# Patient Record
Sex: Female | Born: 1980 | Hispanic: No | Marital: Married | Smoking: Never smoker
Health system: Southern US, Community
[De-identification: ages and names within clinical notes are randomized; demographics above are authoritative.]

## PROBLEM LIST (undated history)

## (undated) DIAGNOSIS — E039 Hypothyroidism, unspecified: Secondary | ICD-10-CM

## (undated) DIAGNOSIS — J45909 Unspecified asthma, uncomplicated: Secondary | ICD-10-CM

---

## 2019-03-07 ENCOUNTER — Other Ambulatory Visit: Payer: Self-pay | Admitting: Family Medicine

## 2019-03-07 DIAGNOSIS — O09521 Supervision of elderly multigravida, first trimester: Secondary | ICD-10-CM

## 2019-03-13 ENCOUNTER — Ambulatory Visit
Admission: RE | Admit: 2019-03-13 | Discharge: 2019-03-13 | Disposition: A | Discharge status: Home / Self Care | Payer: BC Managed Care – PPO | Source: Ambulatory Visit | Attending: Family Medicine | Admitting: Family Medicine

## 2019-03-13 ENCOUNTER — Other Ambulatory Visit: Payer: Self-pay

## 2019-03-13 DIAGNOSIS — O09521 Supervision of elderly multigravida, first trimester: Secondary | ICD-10-CM | POA: Diagnosis present

## 2019-05-09 NOTE — L&D Delivery Note (Signed)
Delivery Note 39yo G2P1001 at 18+0wks with previable PPROM and cord prolapse presented with 3 cm cervix and no fetal heart tones. She was given 2 doses of pv 487mcg misoprostol and spontaneously delivered a female infant without heartbeat.   The placenta followed a dose of Hemabate (with lomotil and acetaminophen) and 600 mcg misoprostol placed rectally. The placenta was gently extracted in pieces. Pt declined pain meds throughout, but tolerated manual extraction of fundal placenta. I will give her a dose of ancef following this procedure. A bedside ultrasound showed clots but no remaining membranes.   We have discussed going to the OR for a full exam under anesthesia and she declines at this time. We have discussed retained products and bleeding/infection to monitor for.  Anesthesia: None Episiotomy: None Lacerations: None Est. Blood Loss (mL):  923ml, including clots extracted 2 hrs after delivery with placenta.  Infant is female, with normal anatomy. Sealed eyes, no defects noted on close exam. 3 vessel cord.  Will discussed autopsy with patient prior to discharge.  Repeat CBC now and prior to discharge. Currently afebrile without fundal tenderness.  Benjaman Kindler 05/29/2019, 6:34 PM

## 2019-05-23 ENCOUNTER — Inpatient Hospital Stay
Admission: EM | Admit: 2019-05-23 | Discharge: 2019-05-25 | DRG: 833 | Disposition: A | Discharge status: Home / Self Care | Payer: BC Managed Care – PPO | Attending: Obstetrics and Gynecology | Admitting: Obstetrics and Gynecology

## 2019-05-23 ENCOUNTER — Encounter: Payer: Self-pay | Admitting: Emergency Medicine

## 2019-05-23 ENCOUNTER — Other Ambulatory Visit: Payer: Self-pay

## 2019-05-23 DIAGNOSIS — O99282 Endocrine, nutritional and metabolic diseases complicating pregnancy, second trimester: Secondary | ICD-10-CM | POA: Diagnosis present

## 2019-05-23 DIAGNOSIS — Z3A17 17 weeks gestation of pregnancy: Secondary | ICD-10-CM

## 2019-05-23 DIAGNOSIS — E039 Hypothyroidism, unspecified: Secondary | ICD-10-CM | POA: Diagnosis present

## 2019-05-23 DIAGNOSIS — O09522 Supervision of elderly multigravida, second trimester: Secondary | ICD-10-CM

## 2019-05-23 DIAGNOSIS — D259 Leiomyoma of uterus, unspecified: Secondary | ICD-10-CM | POA: Diagnosis present

## 2019-05-23 DIAGNOSIS — O368390 Maternal care for abnormalities of the fetal heart rate or rhythm, unspecified trimester, not applicable or unspecified: Secondary | ICD-10-CM

## 2019-05-23 DIAGNOSIS — O42912 Preterm premature rupture of membranes, unspecified as to length of time between rupture and onset of labor, second trimester: Secondary | ICD-10-CM

## 2019-05-23 DIAGNOSIS — O3412 Maternal care for benign tumor of corpus uteri, second trimester: Secondary | ICD-10-CM | POA: Diagnosis present

## 2019-05-23 DIAGNOSIS — O42919 Preterm premature rupture of membranes, unspecified as to length of time between rupture and onset of labor, unspecified trimester: Secondary | ICD-10-CM | POA: Diagnosis present

## 2019-05-23 DIAGNOSIS — O4290 Premature rupture of membranes, unspecified as to length of time between rupture and onset of labor, unspecified weeks of gestation: Secondary | ICD-10-CM | POA: Diagnosis present

## 2019-05-23 HISTORY — DX: Hypothyroidism, unspecified: E03.9

## 2019-05-23 HISTORY — DX: Unspecified asthma, uncomplicated: J45.909

## 2019-05-23 LAB — CBC
HCT: 33.2 % — ABNORMAL LOW (ref 36.0–46.0)
Hemoglobin: 11.1 g/dL — ABNORMAL LOW (ref 12.0–15.0)
MCH: 26.9 pg (ref 26.0–34.0)
MCHC: 33.4 g/dL (ref 30.0–36.0)
MCV: 80.6 fL (ref 80.0–100.0)
Platelets: 172 10*3/uL (ref 150–400)
RBC: 4.12 MIL/uL (ref 3.87–5.11)
RDW: 15.5 % (ref 11.5–15.5)
WBC: 12.2 10*3/uL — ABNORMAL HIGH (ref 4.0–10.5)
nRBC: 0 % (ref 0.0–0.2)

## 2019-05-23 LAB — URINALYSIS, COMPLETE (UACMP) WITH MICROSCOPIC
Bacteria, UA: NONE SEEN
Bilirubin Urine: NEGATIVE
Glucose, UA: NEGATIVE mg/dL
Hgb urine dipstick: NEGATIVE
Ketones, ur: 20 mg/dL — AB
Nitrite: NEGATIVE
Protein, ur: NEGATIVE mg/dL
Specific Gravity, Urine: 1.006 (ref 1.005–1.030)
pH: 6 (ref 5.0–8.0)

## 2019-05-23 LAB — WET PREP, GENITAL
Clue Cells Wet Prep HPF POC: NONE SEEN
Sperm: NONE SEEN
Trich, Wet Prep: NONE SEEN
Yeast Wet Prep HPF POC: NONE SEEN

## 2019-05-23 LAB — COMPREHENSIVE METABOLIC PANEL
ALT: 14 U/L (ref 0–44)
AST: 15 U/L (ref 15–41)
Albumin: 3.3 g/dL — ABNORMAL LOW (ref 3.5–5.0)
Alkaline Phosphatase: 53 U/L (ref 38–126)
Anion gap: 10 (ref 5–15)
BUN: 9 mg/dL (ref 6–20)
CO2: 20 mmol/L — ABNORMAL LOW (ref 22–32)
Calcium: 8.8 mg/dL — ABNORMAL LOW (ref 8.9–10.3)
Chloride: 107 mmol/L (ref 98–111)
Creatinine, Ser: 0.71 mg/dL (ref 0.44–1.00)
GFR calc Af Amer: >60 mL/min (ref >60)
GFR calc non Af Amer: >60 mL/min (ref >60)
Glucose, Bld: 85 mg/dL (ref 70–99)
Potassium: 3.7 mmol/L (ref 3.5–5.1)
Sodium: 137 mmol/L (ref 135–145)
Total Bilirubin: 0.5 mg/dL (ref 0.3–1.2)
Total Protein: 6.8 g/dL (ref 6.5–8.1)

## 2019-05-23 LAB — TYPE AND SCREEN
ABO/RH(D): A POS
Antibody Screen: NEGATIVE

## 2019-05-23 LAB — T4, FREE: Free T4: 0.75 ng/dL (ref 0.61–1.12)

## 2019-05-23 LAB — RUPTURE OF MEMBRANE (ROM)PLUS: Rom Plus: POSITIVE

## 2019-05-23 LAB — TSH: TSH: 1.108 u[IU]/mL (ref 0.350–4.500)

## 2019-05-23 MED ORDER — PRENATAL MULTIVITAMIN CH: 1.0000 | ORAL_TABLET | Freq: Every day | ORAL | Status: DC

## 2019-05-23 MED ORDER — ZOLPIDEM TARTRATE 5 MG PO TABS: 5.0000 mg | ORAL_TABLET | Freq: Every evening | ORAL | Status: DC | PRN

## 2019-05-23 MED ORDER — AZITHROMYCIN 500 MG PO TABS
1000.0000 mg | ORAL_TABLET | Freq: Once | ORAL | Status: AC
  Administered 2019-05-23: 1000 mg via ORAL
  Filled 2019-05-23: qty 2

## 2019-05-23 MED ORDER — SODIUM CHLORIDE 0.9 % IV SOLN
2.0000 g | Freq: Four times a day (QID) | INTRAVENOUS | Status: AC
  Administered 2019-05-23 – 2019-05-25 (×8): 2 g via INTRAVENOUS
  Filled 2019-05-23 (×8): qty 2000

## 2019-05-23 MED ORDER — LEVOTHYROXINE SODIUM 100 MCG PO TABS
100.0000 ug | ORAL_TABLET | Freq: Every day | ORAL | Status: DC
  Administered 2019-05-24 – 2019-05-25 (×2): 100 ug via ORAL
  Filled 2019-05-23 (×3): qty 1

## 2019-05-23 MED ORDER — ACETAMINOPHEN 325 MG PO TABS
650.0000 mg | ORAL_TABLET | ORAL | Status: DC | PRN
  Administered 2019-05-24: 650 mg via ORAL
  Filled 2019-05-23: qty 2

## 2019-05-23 MED ORDER — ONDANSETRON HCL 4 MG/2ML IJ SOLN: 4.0000 mg | Freq: Four times a day (QID) | INTRAMUSCULAR | Status: DC | PRN

## 2019-05-23 MED ORDER — LACTATED RINGERS IV SOLN: INTRAVENOUS | Status: DC

## 2019-05-23 MED ORDER — CALCIUM CARBONATE ANTACID 500 MG PO CHEW: 2.0000 | CHEWABLE_TABLET | ORAL | Status: DC | PRN

## 2019-05-23 NOTE — Progress Notes (Signed)
Patient ID: Jeanette Reyes, female   DOB: 12-01-1980, 39 y.o.   MRN: [id number] Interviewed pt tonight . PPROM today with + rom plus . Uncomplicated last pregnancy . Declined genetic studies at [facility] + covid infection Nov 2020.  Fever without respiratory compromise.  U/s today by me after Rn couldn't find FHT. + FHM 140's . 2 small pckets of amniotic 1.6+1.2 cm . VTX  Husband still in quarantine with + covid  O: Afebrile   Abd : soft Non tender  Pelvic deferred  A: PPROM midtrimester P: spoke to the pt of unlikely( very remote) chance of reseal and reaccummulation of fluid . Most likely scenerio of fetal demise and then starting induction for expulsion ie .. cytotec. Will treat conservatively for now with Ampicillin and azithromycin  Check FHM in am  Or if patient develops symptoms . Foley catheter placement  Ambien prn  Offered chaplain services

## 2019-05-23 NOTE — OB Triage Note (Signed)
Pt G2P1 [redacted]w[redacted]d complains of LOF since 0630 this morning. Patient states she noticed a gush that woke her up and states that it was clear and malodorous. She states that since then she has had fluid leak every time she gets up and it has remained clear and malodorours. Pt denies ctx and states she currently has no vaginal bleeding but has had bleeding throughout her entire pregnancy. She states the last time she noted bleeding was 05/03/19. Pt states she had sex 5 days ago and has had not trauma to the abdominal area. CNM at bedside to discuss plan of care with patient and answer any questions the patient had.

## 2019-05-23 NOTE — ED Triage Notes (Signed)
17 week preg, leaking clear fluid this morning. Only potential injury was she stumbled yesterday.  Denies cramping or pain

## 2019-05-23 NOTE — H&P (Signed)
OB History & Physical   History of Present Illness:  Chief Complaint: Leaking of fluid at 17 weeks  HPI:  Jeanette Reyes is a 39 y.o. G2P1001 female at [redacted]w[redacted]d dated by LMP of 01/18/2019.  She presents to L&D for leaking fluids.  She reports at 0630 she woke up and her clothes and the bed were wet. She got up to the use the restroom and the leaking continued.  She continues to feel gushing every time she is up walking around.    She reports:  -no vaginal bleeding -no contractions  Pregnancy Issues: 1. Advanced maternal age  32 ASA, 2. Prepregnancy BMI 32 ? A1c: 5.7, early 1h GTT 140  3h GTT [100,140,135,122] 3. Fibroid  ? 6x6x6cm on initial [place] at [facility] 4. First trimester bleeding ? Spotting has been an issue throughout first trimester 5. Hx infertility for yrs ? spontaneous conception. 6. Rubella equivocal ? offer PP Vaccination 7. COVID illness during pregnancy ? Pos 11/26 ? Family tested positive recently 43. Hx macrosomic infant ? 9#7 at 41.3wks  SVD, no shoulder dystocia, no GDM    9. Hypothyroidism  Synthroid 131mcg   Maternal Medical History:   Past Medical History:  Diagnosis Date  . Asthma    allergy induced  . Hypothyroidism     History reviewed. No pertinent surgical history.  No Known Allergies  Prior to Admission medications   Medication Sig Start Date End Date Taking? Authorizing Provider  aspirin 81 MG chewable tablet Chew by mouth daily.   Yes [provider]  levothyroxine (SYNTHROID) 100 MCG tablet Take 100 mcg by mouth daily before breakfast.   Yes [provider]  Prenatal Vit-Fe Fumarate-FA (MULTIVITAMIN-PRENATAL) 27-0.8 MG TABS tablet Take 1 tablet by mouth daily at 12 noon.   Yes [provider]     Prenatal care site: [place] History: She  reports that she has never smoked. She has never used smokeless tobacco. She reports that she does not drink alcohol or use drugs.  Family  History: family history is not on file.   Review of Systems: A full review of systems was performed and negative except as noted in the HPI.    Physical Exam:  Vital Signs: BP 130/64 (BP Location: Right Arm)   Pulse 91   Temp 97.9 F (36.6 C) (Oral)   Resp 16   Ht 6\' 5"  (1.956 m)   Wt 107.5 kg   LMP 01/18/2019   SpO2 100%   BMI 28.10 kg/m   General:   alert and cooperative  Skin:  normal  Neurologic:    Alert & oriented x 3  Lungs:   nl effort  Heart:   regular rate and rhythm  Abdomen:  soft non tender  Pelvis:  Exam deferred.  Extremities: : non-tender, symmetric     Results for orders placed or performed during the hospital encounter of 05/23/19 (from the past 24 hour(s))  Wet prep, genital     Status: Abnormal   Collection Time: 05/23/19 12:05 PM   Specimen: Cervix  Result Value Ref Range   Yeast Wet Prep HPF POC NONE SEEN NONE SEEN   Trich, Wet Prep NONE SEEN NONE SEEN   Clue Cells Wet Prep HPF POC NONE SEEN NONE SEEN   WBC, Wet Prep HPF POC MANY (A) NONE SEEN   Sperm NONE SEEN   CBC     Status: Abnormal   Collection Time: 05/23/19 12:40 PM  Result Value  Ref Range   WBC 12.2 (H) 4.0 - 10.5 K/uL   RBC 4.12 3.87 - 5.11 MIL/uL   Hemoglobin 11.1 (L) 12.0 - 15.0 g/dL   HCT 33.2 (L) 36.0 - 46.0 %   MCV 80.6 80.0 - 100.0 fL   MCH 26.9 26.0 - 34.0 pg   MCHC 33.4 30.0 - 36.0 g/dL   RDW 15.5 11.5 - 15.5 %   Platelets 172 150 - 400 K/uL   nRBC 0.0 0.0 - 0.2 %  CMP     Status: Abnormal   Collection Time: 05/23/19 12:40 PM  Result Value Ref Range   Sodium 137 135 - 145 mmol/L   Potassium 3.7 3.5 - 5.1 mmol/L   Chloride 107 98 - 111 mmol/L   CO2 20 (L) 22 - 32 mmol/L   Glucose, Bld 85 70 - 99 mg/dL   BUN 9 6 - 20 mg/dL   Creatinine, Ser 0.71 0.44 - 1.00 mg/dL   Calcium 8.8 (L) 8.9 - 10.3 mg/dL   Total Protein 6.8 6.5 - 8.1 g/dL   Albumin 3.3 (L) 3.5 - 5.0 g/dL   AST 15 15 - 41 U/L   ALT 14 0 - 44 U/L   Alkaline Phosphatase 53 38 - 126 U/L   Total Bilirubin 0.5  0.3 - 1.2 mg/dL   GFR calc non Af Amer >60 >60 mL/min   GFR calc Af Amer >60 >60 mL/min   Anion gap 10 5 - 15    Pertinent Results:  Prenatal Labs: Blood type/Rh A pos  Antibody screen neg  Rubella Equivical  Varicella Immune  RPR NR  HBsAg Neg  HIV NR  GC neg  Chlamydia neg  Genetic screening Declined  1 hour GTT   3 hour GTT   GBS    FHT: noted on U/S by Dr. Ouida Sills   Most recent U/s: 03/13/19 [facility]: Single IUP, yolk sac: visualized, cardiac activity: visualized, FHR=140bpm, [place]: none visualized, CRL = 9.9 mm, GA [redacted]w[redacted]d, left lower uterine fibroid 6.6x6.6x6.3  Assessment:  Jeanette Reyes is a 39 y.o. G2P1001 female at [redacted]w[redacted]d with PPROM at [redacted]w[redacted]d.  Plan:   Leaking Fluid ROM+ pos C/w Dr. Ouida Sills who attended at [facility] to do an u/s and discussed POC with pt Ampicillin 2g IV Q6hr x 48 hr Azithromycin 1000mg  PO once Complete Bedrest with Foley catheter Fetal doppler Q shift SCDs  Routine OB Prenatal Vitamin Regular Diet  Hypothyroidism Continue Synthroid 13mcg while admitted  Plan to admit to L&D for 48 hours in order to complete ABX therapy.  Will then re-evaluate fetal status and determine further action.     Linda Hedges, CNM 05/23/2019 1:31 PM

## 2019-05-23 NOTE — ED Notes (Signed)
Spoke with Dr .Kerman Passey in reference to pt and protocols.  At this time holding off on lab work

## 2019-05-23 NOTE — ED Provider Notes (Signed)
[facility] Emergency Department Provider Note  Time seen: 12:12 PM  I have reviewed the triage vital signs and the nursing notes.   HISTORY  Chief Complaint Vaginal Discharge   HPI Jianna Giudice is a 39 y.o. female G-tube P1 presents to the emergency department approximately [redacted] weeks pregnant with leakage of fluid.  According to the patient she awoke overnight and felt fluid going down her leg.  States she went back to bed and when she got up later this morning once again had a gush of fluid.  Patient states she follows up with [facility] clinic, call them this morning they asked her to come to the ER for further evaluation.  Patient denies any fever cough congestion or shortness of breath.  Denies any abdominal pain or cramping.  Patient states yesterday she did misstep and took a hard step that jarred her abdomen but denies any falls or direct trauma to the abdomen.  No history of fluid leakage or complications with her first pregnancy which was a NSVD.   History reviewed. No pertinent past medical history.  There are no problems to display for this patient.   History reviewed. No pertinent surgical history.  Prior to Admission medications   Not on File    No Known Allergies  No family history on file.  Social History Social History   Tobacco Use  . Smoking status: Never Smoker  . Smokeless tobacco: Never Used  Substance Use Topics  . Alcohol use: Not on file  . Drug use: Not on file    Review of Systems Constitutional: Negative for fever. Cardiovascular: Negative for chest pain. Respiratory: Negative for shortness of breath. Gastrointestinal: Negative for abdominal pain Genitourinary: No urinary symptoms.  Leakage of fluid vaginally this morning. Musculoskeletal: Negative for musculoskeletal complaints Neurological: Negative for headache All other ROS negative  ____________________________________________   PHYSICAL EXAM:  VITAL  SIGNS: ED Triage Vitals [05/23/19 0918]  Enc Vitals Group     BP 138/77     Pulse Rate 97     Resp 18     Temp 98.4 F (36.9 C)     Temp Source Oral     SpO2 100 %     Weight 236 lb (107 kg)     Height 6' (1.829 m)     Head Circumference      Peak Flow      Pain Score 0     Pain Loc      Pain Edu?      Excl. in [place]?    Constitutional: Alert and oriented. Well appearing and in no distress. Eyes: Normal exam ENT      Head: Normocephalic and atraumatic.      Mouth/Throat: Mucous membranes are moist. Cardiovascular: Normal rate, regular rhythm.  Respiratory: Normal respiratory effort without tachypnea nor retractions. Breath sounds are clear Gastrointestinal: Soft and nontender. No distention.   Musculoskeletal: Nontender with normal range of motion in all extremities Neurologic:  Normal speech and language. No gross focal neurologic deficits  Skin:  Skin is warm, dry and intact.  Psychiatric: Mood and affect are normal.   ____________________________________________   INITIAL IMPRESSION / ASSESSMENT AND PLAN / ED COURSE  Pertinent labs & imaging results that were available during my care of the patient were reviewed by me and considered in my medical decision making (see chart for details).   Patient presents to the emergency department [redacted] weeks pregnant with possible leakage of fluid.  Bedside ultrasound confirms  minimal amniotic fluid around intrauterine pregnancy with a heart rate of 162 on Doppler mode.  Pelvic exam confirms significant amount of clear fluid vaginally with a pH of 7.0.  Highly suspect premature rupture of membranes.  I discussed the patient with OB/GYN.  I have ordered basic labs and we will bring the patient upstairs to labor and delivery.  I discussed the patient with Dr. Ouida Sills as well as Linda Hedges of [place] clinic OB.  Basic labs have been sent.  They would like the patient brought upstairs to labor and delivery for further  evaluation.  Chondra Bloodsaw was evaluated in Emergency Department on 05/23/2019 for the symptoms described in the history of present illness. She was evaluated in the context of the global COVID-19 pandemic, which necessitated consideration that the patient might be at risk for infection with the SARS-CoV-2 virus that causes COVID-19. Institutional protocols and algorithms that pertain to the evaluation of patients at risk for COVID-19 are in a state of rapid change based on information released by regulatory bodies including the CDC and federal and state organizations. These policies and algorithms were followed during the patient's care in the ED.  ____________________________________________   FINAL CLINICAL IMPRESSION(S) / ED DIAGNOSES  Premature rupture of membranes   Harvest Dark, MD 05/23/19 1230

## 2019-05-23 NOTE — Discharge Instructions (Addendum)
Premature Rupture and Preterm Premature Rupture of Membranes  A sac made up of membranes surrounds your baby in the womb (uterus). Rupture of membranes is when this sac breaks open. This is also known as your "water breaking." When this sac breaks before labor starts, it is called premature rupture of membranes (PRO). If this happens before 37 weeks of being pregnant, it is called preterm premature rupture of membranes (PPROM). PPROM is serious. It needs medical care right away. What increases the risk of PPROM? PPROM is more likely to happen in women who:  Have an infection.  Have had PPROM before.  Have a cervix that is short.  Have bleeding during the second or third trimester.  Have a low BMI. This is a measure of body fat.  Smoke.  Use drugs.  Have a low socioeconomic status. What problems can be caused by PROM and PPROM? This condition creates health dangers for the mother and the baby. These include:  Giving birth to the baby too early (prematurely).  Getting a serious infection of the placenta (chorioamnionitis).  Having the placenta detach from the uterus early (placental abruption).  Squeezing of the umbilical cord.  Getting a serious infection after delivery. What happens if I am told that I have PROM or PPROM? You will have tests done at the hospital.  If you have PROM, you may be given medicine to start labor (be induced). This may be done if you are not having contractions during the 24 hours after your water broke.  If you have PPROM and are not having contractions, you may be given medicine to start labor. It will depend on how far along you are in your pregnancy. If you have PPROM:  You and your baby will be watched closely to see if you have infections or other problems.  You may be given: ? An antibiotic medicine. This can stop an infection from starting.  You may be told to stay in bed except to use the bathroom (bed rest).  You may be given  medicine to start labor. This may be done if there are problems with you or the baby. Your treatment will depend on many factors. Summary  When your water breaks before labor starts, it is called premature rupture of membranes (PROM).  When PROM happens before 37 weeks of pregnancy, it is called preterm premature rupture of membranes (PPROM).  If you are not having contractions, your labor may be started for you. This information is not intended to replace advice given to you by your health care provider. Make sure you discuss any questions you have with your health care provider. Document Revised: 08/16/2018 Document Reviewed: 01/13/2016 Elsevier Patient Education  [place].   Activity Restriction During Pregnancy Your health care provider may recommend specific activity restrictions during pregnancy for a variety of reasons. Activity restriction may require that you limit activities that require great effort, such as exercise, lifting, or sex. The type of activity restriction will vary for each person, depending on your risk or the problems you are having. Activity restriction may be recommended for a period of time until your baby is delivered. Why are activity restrictions recommended? Activity restriction may be recommended if:  Your placenta is partially or completely covering the opening of your cervix (placenta previa).  There is bleeding between the wall of the uterus and the amniotic sac in the first trimester of pregnancy (subchorionic hemorrhage).  You went into labor too early (preterm labor).  You  have a history of miscarriage.  You have a condition that causes high blood pressure during pregnancy (preeclampsia or eclampsia).  You are pregnant with more than one baby.  Your baby is not growing well. What are the risks? The risks depend on your specific restriction. Strict bed rest has the most physical and emotional risks and is no longer routinely  recommended. Risks of strict bed rest include:  Loss of muscle conditioning from not moving.  Blood clots.  Social isolation.  Depression.  Loss of income. Talk with your health care team about activity restriction to decide if it is best for you and your baby. Even if you are having problems during your pregnancy, you may be able to continue with normal levels of activity with careful monitoring by your health care team. Follow these instructions at home: If needed, based on your overall health and the health of your baby, your health care provider will decide which type of activity restriction is right for you. Activity restrictions may include:  Resting in a sitting position or lying down for periods of time during the day. Pelvic rest is recommended along with activity restrictions. If pelvic rest is recommended, then:  Do not have sex, an orgasm, or use sexual stimulation.  Do not use tampons. Do not douche. Do not put anything into your vagina.  Do not lift anything that is heavier than 10 lb (4.5 kg).  Avoid activities that require a lot of effort.  Avoid any activity in which your pelvic muscles could become strained, such as squatting. Questions to ask your health care provider  Why is my activity being limited?  How will activity restrictions affect my body?  Why is rest helpful for me and my baby?  What activities can I do?  When can I return to normal activities? When should I seek immediate medical care? Seek immediate medical care if you have:  Vaginal bleeding.  Vaginal discharge.  Cramping pain in your lower abdomen.  Regular contractions.  A low, dull backache. Summary  Your health care provider may recommend specific activity restrictions during pregnancy for a variety of reasons.  Activity restriction may require that you limit activities such as exercise, lifting, sex, or any other activity that requires great effort.  Discuss the risks and  benefits of activity restriction with your health care team to decide if it is best for you and your baby.  Contact your health care provider right away if you think you are having contractions, or if you notice vaginal bleeding, discharge, or cramping. This information is not intended to replace advice given to you by your health care provider. Make sure you discuss any questions you have with your health care provider. Document Revised: 01/15/2019 Document Reviewed: 08/14/2017 Elsevier Patient Education  [place].

## 2019-05-23 NOTE — ED Notes (Signed)
Pt transferred to L&D Obs 1. This RN called and spoke with RN staff in [place] who confirm patient is going to Obs 1 with Dr. Ouida Sills as the admitting MD. Pt transferred via wheelchair by this RN. Pt in stable condition at time of transfer to L&D Obs 1.

## 2019-05-23 NOTE — ED Notes (Addendum)
This Rn to bedside with the pelvic cart, pt given gown and instructed to change into gown for EDP to perform pelvic exam. Pt A&O x 4, NAD noted at this time, respirations even and unlabored, skin warm, dry, and intact. Pt states is a G2P1L1.

## 2019-05-24 ENCOUNTER — Inpatient Hospital Stay: Payer: BC Managed Care – PPO

## 2019-05-24 LAB — RPR: RPR Ser Ql: NONREACTIVE

## 2019-05-24 NOTE — Progress Notes (Signed)
ANTEPARTUM PROGRESS NOTE  Jeanette Reyes is a 39 y.o. G2P1001 at [redacted]w[redacted]d who is admitted for PPROM in second trimester.   LMP: 01/18/19 Estimated Date of Delivery: 10/30/19  Length of Stay:  1 Days. Admitted 05/23/2019  Subjective: Pt is emotional distressed and dispondent.  Her mother arrived this morning for support while husband and son are in quarantine.  Pt reports 2 episodes of diarrhea around midnight last night. This morning she reports a constant feeling of wetness but no gushes or significant leaking through the night, even with the BMs. Denies bleeding or UCs/cramping.  RN tried to find [place] this morning and was unable to, which was distressing to the pt so an u/s was ordered. FHT found on u/s at 178 bpm. Pt aware.   Vitals:  BP (!) 102/52 (BP Location: Right Arm)   Pulse 90   Temp (!) 97.5 F (36.4 C) (Oral)   Resp 16   Ht 6' (1.829 m)   Wt 107.5 kg   LMP 01/18/2019   SpO2 100%   BMI 32.14 kg/m    Physical Examination: General:   alert and moderate distress  Skin:  normal  Neurologic:    Alert & oriented x 3  Lungs:   nl effort  Heart:   regular rate and rhythm  Abdomen:  soft, non-tender  Pelvis:  Exam deferred.  Extremities: : non-tender, symmetric    Fetal monitoring: FHR: 178 bpm  Current scheduled medications . levothyroxine  100 mcg Oral Q0600  . prenatal multivitamin  1 tablet Oral Q1200    I have reviewed the patient's current medications.  ASSESSMENT: Patient Active Problem List   Diagnosis Date Noted  . Amniotic fluid leaking 05/23/2019  . Preterm premature rupture of membranes (PPROM) with unknown onset of labor 05/23/2019    PLAN:  Leaking Fluid Minimal leaking today Continue: Ampicillin 2g IV Q6hr x 48 hr D/c foley catheter and allow bedrest with bathroom priveledges Continue fetal doppler Q shift Continue SCDs  Routine OB Prenatal Vitamin Regular Diet  Hypothyroidism Continue Synthroid 127mcg while admitted  Pregnancy  Complicated by: 1. Advanced maternal age 45. Prepregnancy BMI 32 3. Fibroid  4. First trimester bleeding 5. Hx infertility for yrs - spontaneous conception 6. Rubella equivocal 7. COVID illness during pregnancy 8. Hx macrosomic infant 9. Hypothyroidism          Continue routine antenatal care.  Clydene Laming, CNM 05/24/2019 10:44 AM

## 2019-05-24 NOTE — Discharge Summary (Signed)
Patient ID: Jeanette Reyes MRN: [id number] DOB/AGE: 1981-01-13 39 y.o.  Admit date: 05/23/2019 Discharge date: 05/25/2019  Admission Diagnoses: Leaking of Fluid at [redacted]w[redacted]d  Discharge Diagnoses: PPROM at 17 weeks without labor.  Prenatal Procedures: none  Consults: none  Significant Diagnostic Studies:  Results for orders placed or performed during the hospital encounter of 05/23/19 (from the past 168 hour(s))  Wet prep, genital   Collection Time: 05/23/19 12:05 PM   Specimen: Cervix  Result Value Ref Range   Yeast Wet Prep HPF POC NONE SEEN NONE SEEN   Trich, Wet Prep NONE SEEN NONE SEEN   Clue Cells Wet Prep HPF POC NONE SEEN NONE SEEN   WBC, Wet Prep HPF POC MANY (A) NONE SEEN   Sperm NONE SEEN   CBC   Collection Time: 05/23/19 12:40 PM  Result Value Ref Range   WBC 12.2 (H) 4.0 - 10.5 K/uL   RBC 4.12 3.87 - 5.11 MIL/uL   Hemoglobin 11.1 (L) 12.0 - 15.0 g/dL   HCT 33.2 (L) 36.0 - 46.0 %   MCV 80.6 80.0 - 100.0 fL   MCH 26.9 26.0 - 34.0 pg   MCHC 33.4 30.0 - 36.0 g/dL   RDW 15.5 11.5 - 15.5 %   Platelets 172 150 - 400 K/uL   nRBC 0.0 0.0 - 0.2 %  CMP   Collection Time: 05/23/19 12:40 PM  Result Value Ref Range   Sodium 137 135 - 145 mmol/L   Potassium 3.7 3.5 - 5.1 mmol/L   Chloride 107 98 - 111 mmol/L   CO2 20 (L) 22 - 32 mmol/L   Glucose, Bld 85 70 - 99 mg/dL   BUN 9 6 - 20 mg/dL   Creatinine, Ser 0.71 0.44 - 1.00 mg/dL   Calcium 8.8 (L) 8.9 - 10.3 mg/dL   Total Protein 6.8 6.5 - 8.1 g/dL   Albumin 3.3 (L) 3.5 - 5.0 g/dL   AST 15 15 - 41 U/L   ALT 14 0 - 44 U/L   Alkaline Phosphatase 53 38 - 126 U/L   Total Bilirubin 0.5 0.3 - 1.2 mg/dL   GFR calc non Af Amer >60 >60 mL/min   GFR calc Af Amer >60 >60 mL/min   Anion gap 10 5 - 15  Urinalysis, Complete w Microscopic   Collection Time: 05/23/19  1:13 PM  Result Value Ref Range   Color, Urine STRAW (A) YELLOW   APPearance CLEAR (A) CLEAR   Specific Gravity, Urine 1.006 1.005 - 1.030   pH 6.0 5.0 - 8.0    Glucose, UA NEGATIVE NEGATIVE mg/dL   Hgb urine dipstick NEGATIVE NEGATIVE   Bilirubin Urine NEGATIVE NEGATIVE   Ketones, ur 20 (A) NEGATIVE mg/dL   Protein, ur NEGATIVE NEGATIVE mg/dL   Nitrite NEGATIVE NEGATIVE   Leukocytes,Ua TRACE (A) NEGATIVE   RBC / HPF 0-5 0 - 5 RBC/hpf   WBC, UA 0-5 0 - 5 WBC/hpf   Bacteria, UA NONE SEEN NONE SEEN   Squamous Epithelial / LPF 0-5 0 - 5  ROM Plus ([facility] only)   Collection Time: 05/23/19  2:15 PM  Result Value Ref Range   Rom Plus POSITIVE   RPR   Collection Time: 05/23/19  4:57 PM  Result Value Ref Range   RPR Ser Ql NON REACTIVE NON REACTIVE  TSH   Collection Time: 05/23/19  4:57 PM  Result Value Ref Range   TSH 1.108 0.350 - 4.500 uIU/mL  T4, free   Collection Time: 05/23/19  4:57 PM  Result Value Ref Range   Free T4 0.75 0.61 - 1.12 ng/dL  Type and screen [place]   Collection Time: 05/23/19  4:57 PM  Result Value Ref Range   ABO/RH(D) A POS    Antibody Screen NEG    Sample Expiration      05/26/2019,2359 Performed at [facility], [address] [place], [place] [address]     Ultrasounds: 05/24/19 LIMITED OBSTETRIC ULTRASOUND FINDINGS: Number of Fetuses: 1 Heart Rate: 178 bpm Movement: None Presentation: Transverse. Head to the left side of mother. Placental Location: Anterior Previa: None Amniotic Fluid (Subjective): Within normal limits. AFI: 1.3 cm BPD: 3.6 cm 17 w 1 d MATERNAL FINDINGS: Cervix: Appears closed. Uterus/Adnexae: Prominent uterine fibroid is again noted in lower uterine segment measuring 8.4 x 8.3 x 8.1 cm. IMPRESSION: 1. Single viable intrauterine pregnancy with estimated fetal heart rate of 178 beats per minute. Estimated 2. Estimated gestational age is 4 weeks and 2 days. 3. Oligohydramnios, consistent with premature rupture of membranes. Cervix does appear closed.  05/25/19 LIMITED OBSTETRIC ULTRASOUND Comparison: May 24, 2019. FINDINGS: Number  of Fetuses: 1 Heart Rate: 165 bpm Movement: Yes Presentation: Transverse Placental Location: Fundal and anterior. Previa: No Amniotic Fluid (Subjective): Decreased. AFI: 0.3 cm BPD: 3.8 cm 17 w 5 d MATERNAL FINDINGS: Cervix: Appears closed. Uterus/Adnexae: Large uterine fibroid is noted. IMPRESSION: Single live intrauterine gestation of 17 weeks 5 days with estimated heart rate of 165 beats per minute. However, oligohydramnios is again noted consistent with history of premature rupture of membranes.  CBC    Component Value Date/Time   WBC 10.9 (H) 05/25/2019 1300   RBC 4.15 05/25/2019 1300   HGB 11.3 (L) 05/25/2019 1300   HCT 33.7 (L) 05/25/2019 1300   PLT 161 05/25/2019 1300   MCV 81.2 05/25/2019 1300   MCH 27.2 05/25/2019 1300   MCHC 33.5 05/25/2019 1300   RDW 15.5 05/25/2019 1300   LYMPHSABS 2.3 05/25/2019 1300   MONOABS 0.6 05/25/2019 1300   EOSABS 0.1 05/25/2019 1300   BASOSABS 0.1 05/25/2019 1300   Treatments: Ampicillin 2g q6hr x 48hr Azithromycin 1g once [facility] Course:  This is a 39 y.o. G2P1001 with IUP at [redacted]w[redacted]d admitted for SROM.  She reported she woke up at 0630 and her clothes and the bed were wet. Once here she was started on Ampicillin x 48 hrs and Azithromycin once.  She was observed, fetal heart rate present everyday, and she had no signs/symptoms of progressing labor or infection.  She was deemed stable for discharge to home with outpatient follow up and PO ABX therapy.  Discharge Physical Exam:  BP 116/69   Pulse 83   Temp 98 F (36.7 C) (Oral)   Resp 16   Ht 6' (1.829 m)   Wt 107.5 kg   LMP 01/18/2019   SpO2 100%   BMI 32.14 kg/m   General: NAD CV: RRR Pulm: CTABL, nl effort ABD: s/nd/nt, gravid DVT Evaluation: LE non-ttp, no evidence of DVT on exam.   Discharge Condition: Stable  Disposition: Discharge disposition: 01-Home or Self Care     Home   Allergies as of 05/25/2019   No Known Allergies     Medication  List    TAKE these medications   ampicillin 500 MG capsule Commonly known as: PRINCIPEN Take 1 capsule (500 mg total) by mouth 3 (three) times daily for 5 days.   aspirin 81 MG chewable tablet Chew by mouth  daily.   levothyroxine 100 MCG tablet Commonly known as: SYNTHROID Take 100 mcg by mouth daily before breakfast.   multivitamin-prenatal 27-0.8 MG Tabs tablet Take 1 tablet by mouth daily at 12 noon.      Follow-up Information    Linda Hedges, CNM. Go on 05/27/2019.   Specialty: Certified Nurse Midwife Why: We will call you tomorrow if we need to adjust the time of your appointment Contact information: [place] [id number] [phone]           Signed:  Linda Hedges, CNM 05/25/2019 1:03 PM

## 2019-05-24 NOTE — Progress Notes (Signed)
Audible FHTs heard with doppler by patient and RN at bedside.  Rate unregistered on doppler.  CNM Oxley aware and OK with plan to reassess with next shift.

## 2019-05-25 ENCOUNTER — Inpatient Hospital Stay: Payer: BC Managed Care – PPO

## 2019-05-25 LAB — CBC WITH DIFFERENTIAL/PLATELET
Abs Immature Granulocytes: 0.04 10*3/uL (ref 0.00–0.07)
Basophils Absolute: 0.1 10*3/uL (ref 0.0–0.1)
Basophils Relative: 1 %
Eosinophils Absolute: 0.1 10*3/uL (ref 0.0–0.5)
Eosinophils Relative: 1 %
HCT: 33.7 % — ABNORMAL LOW (ref 36.0–46.0)
Hemoglobin: 11.3 g/dL — ABNORMAL LOW (ref 12.0–15.0)
Immature Granulocytes: 0 %
Lymphocytes Relative: 21 %
Lymphs Abs: 2.3 10*3/uL (ref 0.7–4.0)
MCH: 27.2 pg (ref 26.0–34.0)
MCHC: 33.5 g/dL (ref 30.0–36.0)
MCV: 81.2 fL (ref 80.0–100.0)
Monocytes Absolute: 0.6 10*3/uL (ref 0.1–1.0)
Monocytes Relative: 6 %
Neutro Abs: 7.8 10*3/uL — ABNORMAL HIGH (ref 1.7–7.7)
Neutrophils Relative %: 71 %
Platelets: 161 10*3/uL (ref 150–400)
RBC: 4.15 MIL/uL (ref 3.87–5.11)
RDW: 15.5 % (ref 11.5–15.5)
WBC: 10.9 10*3/uL — ABNORMAL HIGH (ref 4.0–10.5)
nRBC: 0 % (ref 0.0–0.2)

## 2019-05-25 MED ORDER — AMPICILLIN 500 MG PO CAPS: 500.0000 mg | ORAL_CAPSULE | Freq: Three times a day (TID) | ORAL | 0 refills | Status: DC

## 2019-05-25 MED ORDER — OXYCODONE HCL 5 MG PO TABS: 5.0000 mg | ORAL_TABLET | ORAL | Status: DC | PRN

## 2019-05-25 NOTE — Progress Notes (Signed)
Patient discharged to home, all instructions gone over, patient verbalized understanding.transported via wheelchair to car.

## 2019-05-25 NOTE — Progress Notes (Signed)
Patient ID: Jeanette Reyes, female   DOB: [birth year], 39 y.o.   MRN: [id number] eval of pt today with CNM  Oxley   + fhm on u/s , but no reaccumulation of AF  s/p 48 hrs of IV abx  Exam is benign  UTX non tender A: d/c home with strict precautions to RTC for fever , increasing abd pain or vaginal bleeding . She will follow up with CNM Oxley in office Tuesday with exam and u/s  All questions answered Cont Amoxicillin 500 mg tid for 5 days

## 2019-05-25 NOTE — Progress Notes (Signed)
ANTEPARTUM PROGRESS NOTE  Jeanette Reyes is a 39 y.o. G2P1001 at [redacted]w[redacted]d who is admitted for PPROM in second trimester.   LMP: 01/18/19 Estimated Date of Delivery: 10/30/19  Length of Stay:  2 Days. Admitted 05/23/2019  Subjective: Pt is laying in bed watching tv and more readily asking questions or and engaging in conversation. Her mother is with her and supportive while her husband and son are in quarantine.  This morning she reports not noting a constant feeling of wetness and no gushes or significant leaking through the night.  She does report mucousy discharge with strikes of blood.  Denies bleeding or UCs/cramping.  FHT found on u/s at 165 bpm. Pt aware.   Vitals:  BP 116/69   Pulse 83   Temp 97.8 F (36.6 C) (Oral)   Resp 16   Ht 6' (1.829 m)   Wt 107.5 kg   LMP 01/18/2019   SpO2 100%   BMI 32.14 kg/m    Physical Examination: General:   alert and moderate distress  Skin:  normal  Neurologic:    Alert & oriented x 3  Lungs:   nl effort  Heart:   regular rate and rhythm  Abdomen:  soft, non-tender  Pelvis:  Exam deferred.  Extremities: : non-tender, symmetric    Fetal monitoring: FHR: 165 bpm  Current scheduled medications . levothyroxine  100 mcg Oral Q0600  . prenatal multivitamin  1 tablet Oral Q1200    I have reviewed the patient's current medications.  ASSESSMENT: Patient Active Problem List   Diagnosis Date Noted  . Amniotic fluid leaking 05/23/2019  . Preterm premature rupture of membranes (PPROM) with unknown onset of labor 05/23/2019   Ultrasounds: 05/24/19 LIMITED OBSTETRIC ULTRASOUND FINDINGS: Number of Fetuses: 1 Heart Rate:  178 bpm Movement: None Presentation: Transverse.  Head to the left side of mother. Placental Location: Anterior Previa: None Amniotic Fluid (Subjective):  Within normal limits. AFI: 1.3 cm BPD: 3.6 cm 17 w  1 d MATERNAL FINDINGS: Cervix:  Appears closed. Uterus/Adnexae: Prominent uterine fibroid is again noted in lower  uterine segment measuring 8.4 x 8.3 x 8.1 cm. IMPRESSION: 1. Single viable intrauterine pregnancy with estimated fetal heart rate of 178 beats per minute. Estimated 2. Estimated gestational age is 62 weeks and 2 days. 3. Oligohydramnios, consistent with premature rupture of membranes. Cervix does appear closed.  05/25/19 LIMITED OBSTETRIC ULTRASOUND Comparison: May 24, 2019. FINDINGS: Number of Fetuses: 1 Heart Rate:  165 bpm Movement: Yes Presentation: Transverse Placental Location: Fundal and anterior. Previa: No Amniotic Fluid (Subjective):  Decreased. AFI: 0.3 cm BPD: 3.8 cm 17 w  5 d MATERNAL FINDINGS: Cervix:  Appears closed. Uterus/Adnexae: Large uterine fibroid is noted. IMPRESSION: Single live intrauterine gestation of 17 weeks 5 days with estimated heart rate of 165 beats per minute. However, oligohydramnios is again noted consistent with history of premature rupture of membranes.  PLAN:  Leaking Fluid Minimal leaking today Continue: Ampicillin 2g IV Q6hr x 48 hr Continue fetal doppler Q shift Continue SCDs Will discuss discharge for later today  Routine OB Prenatal Vitamin Regular Diet  Hypothyroidism Continue Synthroid 174mcg while admitted  Pregnancy Complicated by: 1. Advanced maternal age 79. Prepregnancy BMI 32 3. Fibroid  4. First trimester bleeding 5. Hx infertility for yrs - spontaneous conception 6. Rubella equivocal 7. COVID illness during pregnancy 8. Hx macrosomic infant 9. Hypothyroidism          Continue routine antenatal care.  Clydene Laming, CNM 05/25/2019 11:15 AM

## 2019-05-29 ENCOUNTER — Other Ambulatory Visit: Payer: Self-pay | Admitting: Certified Nurse Midwife

## 2019-05-29 ENCOUNTER — Other Ambulatory Visit: Payer: Self-pay

## 2019-05-29 ENCOUNTER — Inpatient Hospital Stay
Admission: EM | Admit: 2019-05-29 | Discharge: 2019-05-30 | DRG: 806 | Disposition: A | Discharge status: Home / Self Care | Payer: BC Managed Care – PPO | Attending: Obstetrics and Gynecology | Admitting: Obstetrics and Gynecology

## 2019-05-29 ENCOUNTER — Encounter: Payer: Self-pay | Admitting: Certified Nurse Midwife

## 2019-05-29 DIAGNOSIS — O09522 Supervision of elderly multigravida, second trimester: Secondary | ICD-10-CM | POA: Diagnosis not present

## 2019-05-29 DIAGNOSIS — O9952 Diseases of the respiratory system complicating childbirth: Secondary | ICD-10-CM | POA: Diagnosis present

## 2019-05-29 DIAGNOSIS — O98511 Other viral diseases complicating pregnancy, first trimester: Secondary | ICD-10-CM | POA: Diagnosis present

## 2019-05-29 DIAGNOSIS — J45909 Unspecified asthma, uncomplicated: Secondary | ICD-10-CM | POA: Diagnosis present

## 2019-05-29 DIAGNOSIS — Z79890 Hormone replacement therapy (postmenopausal): Secondary | ICD-10-CM | POA: Diagnosis not present

## 2019-05-29 DIAGNOSIS — Z3A18 18 weeks gestation of pregnancy: Secondary | ICD-10-CM | POA: Diagnosis not present

## 2019-05-29 DIAGNOSIS — Z23 Encounter for immunization: Secondary | ICD-10-CM

## 2019-05-29 DIAGNOSIS — O3412 Maternal care for benign tumor of corpus uteri, second trimester: Secondary | ICD-10-CM | POA: Diagnosis present

## 2019-05-29 DIAGNOSIS — Z7982 Long term (current) use of aspirin: Secondary | ICD-10-CM

## 2019-05-29 DIAGNOSIS — Z8616 Personal history of COVID-19: Secondary | ICD-10-CM | POA: Diagnosis not present

## 2019-05-29 DIAGNOSIS — O42919 Preterm premature rupture of membranes, unspecified as to length of time between rupture and onset of labor, unspecified trimester: Secondary | ICD-10-CM | POA: Diagnosis present

## 2019-05-29 DIAGNOSIS — O4290 Premature rupture of membranes, unspecified as to length of time between rupture and onset of labor, unspecified weeks of gestation: Secondary | ICD-10-CM

## 2019-05-29 DIAGNOSIS — O99284 Endocrine, nutritional and metabolic diseases complicating childbirth: Secondary | ICD-10-CM | POA: Diagnosis present

## 2019-05-29 DIAGNOSIS — O42912 Preterm premature rupture of membranes, unspecified as to length of time between rupture and onset of labor, second trimester: Secondary | ICD-10-CM | POA: Diagnosis present

## 2019-05-29 DIAGNOSIS — E039 Hypothyroidism, unspecified: Secondary | ICD-10-CM | POA: Diagnosis present

## 2019-05-29 LAB — CBC
HCT: 35 % — ABNORMAL LOW (ref 36.0–46.0)
HCT: 32.4 % — ABNORMAL LOW (ref 36.0–46.0)
Hemoglobin: 11.6 g/dL — ABNORMAL LOW (ref 12.0–15.0)
Hemoglobin: 10.8 g/dL — ABNORMAL LOW (ref 12.0–15.0)
MCH: 26.8 pg (ref 26.0–34.0)
MCH: 26.8 pg (ref 26.0–34.0)
MCHC: 33.1 g/dL (ref 30.0–36.0)
MCHC: 33.3 g/dL (ref 30.0–36.0)
MCV: 80.8 fL (ref 80.0–100.0)
MCV: 80.4 fL (ref 80.0–100.0)
Platelets: 183 10*3/uL (ref 150–400)
Platelets: 187 10*3/uL (ref 150–400)
RBC: 4.33 MIL/uL (ref 3.87–5.11)
RBC: 4.03 MIL/uL (ref 3.87–5.11)
RDW: 15.3 % (ref 11.5–15.5)
RDW: 14.8 % (ref 11.5–15.5)
WBC: 12.8 10*3/uL — ABNORMAL HIGH (ref 4.0–10.5)
WBC: 17.5 10*3/uL — ABNORMAL HIGH (ref 4.0–10.5)
nRBC: 0 % (ref 0.0–0.2)
nRBC: 0 % (ref 0.0–0.2)

## 2019-05-29 LAB — PROTIME-INR
INR: 1.1 (ref 0.8–1.2)
Prothrombin Time: 14.2 seconds (ref 11.4–15.2)

## 2019-05-29 LAB — TYPE AND SCREEN
ABO/RH(D): A POS
Antibody Screen: NEGATIVE

## 2019-05-29 MED ORDER — OXYCODONE-ACETAMINOPHEN 5-325 MG PO TABS: 2.0000 | ORAL_TABLET | ORAL | Status: DC | PRN

## 2019-05-29 MED ORDER — SENNOSIDES-DOCUSATE SODIUM 8.6-50 MG PO TABS: 2.0000 | ORAL_TABLET | ORAL | Status: DC

## 2019-05-29 MED ORDER — BENZOCAINE-MENTHOL 20-0.5 % EX AERO: 1.0000 "application " | INHALATION_SPRAY | CUTANEOUS | Status: DC | PRN

## 2019-05-29 MED ORDER — FERROUS SULFATE 325 (65 FE) MG PO TABS: 325.0000 mg | ORAL_TABLET | Freq: Every day | ORAL | 1 refills | Status: AC

## 2019-05-29 MED ORDER — MISOPROSTOL 200 MCG PO TABS: 600.0000 ug | ORAL_TABLET | Freq: Once | ORAL | Status: DC

## 2019-05-29 MED ORDER — OXYCODONE-ACETAMINOPHEN 5-325 MG PO TABS: 1.0000 | ORAL_TABLET | ORAL | Status: DC | PRN

## 2019-05-29 MED ORDER — SIMETHICONE 80 MG PO CHEW: 80.0000 mg | CHEWABLE_TABLET | ORAL | Status: DC | PRN

## 2019-05-29 MED ORDER — CARBOPROST TROMETHAMINE 250 MCG/ML IM SOLN
INTRAMUSCULAR | Status: AC
  Filled 2019-05-29: qty 1

## 2019-05-29 MED ORDER — BISACODYL 10 MG RE SUPP
10.0000 mg | Freq: Every day | RECTAL | Status: DC | PRN
  Filled 2019-05-29: qty 1

## 2019-05-29 MED ORDER — TETANUS-DIPHTH-ACELL PERTUSSIS 5-2.5-18.5 LF-MCG/0.5 IM SUSP
0.5000 mL | Freq: Once | INTRAMUSCULAR | Status: AC
  Administered 2019-05-29: 0.5 mL via INTRAMUSCULAR
  Filled 2019-05-29 (×2): qty 0.5

## 2019-05-29 MED ORDER — IBUPROFEN 800 MG PO TABS: 800.0000 mg | ORAL_TABLET | Freq: Three times a day (TID) | ORAL | 1 refills | Status: AC | PRN

## 2019-05-29 MED ORDER — DIPHENOXYLATE-ATROPINE 2.5-0.025 MG PO TABS
ORAL_TABLET | ORAL | Status: AC
  Filled 2019-05-29: qty 1

## 2019-05-29 MED ORDER — DIPHENHYDRAMINE HCL 25 MG PO CAPS: 25.0000 mg | ORAL_CAPSULE | Freq: Four times a day (QID) | ORAL | Status: DC | PRN

## 2019-05-29 MED ORDER — IBUPROFEN 600 MG PO TABS: 600.0000 mg | ORAL_TABLET | Freq: Four times a day (QID) | ORAL | Status: DC

## 2019-05-29 MED ORDER — DOCUSATE SODIUM 100 MG PO CAPS: 100.0000 mg | ORAL_CAPSULE | Freq: Two times a day (BID) | ORAL | 0 refills | Status: AC

## 2019-05-29 MED ORDER — ACETAMINOPHEN 500 MG PO TABS
ORAL_TABLET | ORAL | Status: AC
  Filled 2019-05-29: qty 2

## 2019-05-29 MED ORDER — ZOLPIDEM TARTRATE 5 MG PO TABS: 5.0000 mg | ORAL_TABLET | Freq: Every evening | ORAL | Status: DC | PRN

## 2019-05-29 MED ORDER — DIPHENOXYLATE-ATROPINE 2.5-0.025 MG PO TABS
2.0000 | ORAL_TABLET | Freq: Once | ORAL | Status: AC
  Administered 2019-05-29: 2 via ORAL

## 2019-05-29 MED ORDER — ACETAMINOPHEN 500 MG PO TABS
1000.0000 mg | ORAL_TABLET | Freq: Once | ORAL | Status: AC
  Administered 2019-05-29: 1000 mg via ORAL

## 2019-05-29 MED ORDER — ACETAMINOPHEN 325 MG PO TABS: 650.0000 mg | ORAL_TABLET | ORAL | Status: DC | PRN

## 2019-05-29 MED ORDER — ONDANSETRON HCL 4 MG/2ML IJ SOLN: 4.0000 mg | Freq: Four times a day (QID) | INTRAMUSCULAR | Status: DC | PRN

## 2019-05-29 MED ORDER — CARBOPROST TROMETHAMINE 250 MCG/ML IM SOLN
250.0000 ug | Freq: Once | INTRAMUSCULAR | Status: AC
  Administered 2019-05-29: 250 ug via INTRAMUSCULAR

## 2019-05-29 MED ORDER — SODIUM CHLORIDE 0.9% FLUSH: 3.0000 mL | INTRAVENOUS | Status: DC | PRN

## 2019-05-29 MED ORDER — LACTATED RINGERS IV SOLN: INTRAVENOUS | Status: DC

## 2019-05-29 MED ORDER — LACTATED RINGERS IV SOLN: 500.0000 mL | INTRAVENOUS | Status: DC | PRN

## 2019-05-29 MED ORDER — OXYCODONE HCL 5 MG PO TABS: 5.0000 mg | ORAL_TABLET | ORAL | Status: DC | PRN

## 2019-05-29 MED ORDER — MISOPROSTOL 200 MCG PO TABS
400.0000 ug | ORAL_TABLET | ORAL | Status: AC
  Administered 2019-05-29 (×2): 400 ug via VAGINAL
  Filled 2019-05-29: qty 2

## 2019-05-29 MED ORDER — SOD CITRATE-CITRIC ACID 500-334 MG/5ML PO SOLN: 30.0000 mL | ORAL | Status: DC | PRN

## 2019-05-29 MED ORDER — OXYTOCIN BOLUS FROM INFUSION: 500.0000 mL | Freq: Once | INTRAVENOUS | Status: DC

## 2019-05-29 MED ORDER — SODIUM CHLORIDE 0.9 % IV SOLN: 250.0000 mL | INTRAVENOUS | Status: DC | PRN

## 2019-05-29 MED ORDER — SODIUM CHLORIDE 0.9% FLUSH: 3.0000 mL | Freq: Two times a day (BID) | INTRAVENOUS | Status: DC

## 2019-05-29 MED ORDER — MEASLES, MUMPS & RUBELLA VAC IJ SOLR
0.5000 mL | Freq: Once | INTRAMUSCULAR | Status: AC
  Administered 2019-05-29: 0.5 mL via SUBCUTANEOUS
  Filled 2019-05-29 (×2): qty 0.5

## 2019-05-29 MED ORDER — DIBUCAINE (PERIANAL) 1 % EX OINT: 1.0000 "application " | TOPICAL_OINTMENT | CUTANEOUS | Status: DC | PRN

## 2019-05-29 MED ORDER — OXYTOCIN 40 UNITS IN NORMAL SALINE INFUSION - SIMPLE MED: 2.5000 [IU]/h | INTRAVENOUS | Status: DC

## 2019-05-29 MED ORDER — FENTANYL CITRATE (PF) 100 MCG/2ML IJ SOLN: 50.0000 ug | INTRAMUSCULAR | Status: DC | PRN

## 2019-05-29 MED ORDER — ONDANSETRON HCL 4 MG/2ML IJ SOLN: 4.0000 mg | INTRAMUSCULAR | Status: DC | PRN

## 2019-05-29 MED ORDER — COCONUT OIL OIL: 1.0000 "application " | TOPICAL_OIL | Status: DC | PRN

## 2019-05-29 MED ORDER — LIDOCAINE HCL (PF) 1 % IJ SOLN: 30.0000 mL | INTRAMUSCULAR | Status: DC | PRN

## 2019-05-29 MED ORDER — CEFAZOLIN SODIUM-DEXTROSE 2-4 GM/100ML-% IV SOLN
2.0000 g | Freq: Once | INTRAVENOUS | Status: AC
  Administered 2019-05-29: 2 g via INTRAVENOUS
  Filled 2019-05-29: qty 100

## 2019-05-29 MED ORDER — MISOPROSTOL 200 MCG PO TABS
ORAL_TABLET | ORAL | Status: AC
  Filled 2019-05-29: qty 2

## 2019-05-29 MED ORDER — WITCH HAZEL-GLYCERIN EX PADS: 1.0000 "application " | MEDICATED_PAD | CUTANEOUS | Status: DC | PRN

## 2019-05-29 MED ORDER — ONDANSETRON HCL 4 MG PO TABS: 4.0000 mg | ORAL_TABLET | ORAL | Status: DC | PRN

## 2019-05-29 NOTE — Progress Notes (Signed)
Opened in error

## 2019-05-29 NOTE — H&P (Signed)
OB History & Physical   History of Present Illness:  Chief Complaint:   HPI:  Jeanette Reyes is a 39 y.o. G2P1001 female at [redacted]w[redacted]d dated by LMP of 01/18/2019.  She presents to L&D suspecting the umbilical cord is in the vagina. On 05/23/19 at [redacted]w[redacted]d she was admitted for SROM.  She reported she woke up at 0630 and her clothes and the bed were wet. PV PROM without labor was confirmed.  Once in L&D she was started on Ampicillin x 48 hrs and Azithromycin once.  She was observed, fetal heart rate present everyday of admission, and she had no signs/symptoms of progressing labor or infection.  She was deemed stable for discharge to home with outpatient follow up and PO ABX therapy.  She was then seen in the office on 05/27/19 for visit.  U/s showed FHT and small pocket of fluid.  She reports:  - LOF that has not changed over the last 7 days / SROM at 17 weeks -no vaginal bleeding -no contractions or cramping  Pregnancy Issues: 1. Advanced maternal age  5 ASA, 2. Prepregnancy BMI 32 ? A1c: 5.7, early 1h GTT 140  3h GTT [100,140,135,122] 3. Fibroid  ? 6x6x6cm on initial [place] at [facility] 4. First trimester bleeding ? Spotting has been an issue throughout first trimester 5. Hx infertility for yrs ? spontaneous conception. 6. Rubella equivocal ? offer PP Vaccination 7. COVID illness during pregnancy ? Pos 11/26 ? Family tested positive recently 23. Hx macrosomic infant ? 9#7 at 41.3wks  SVD, no shoulder dystocia, no GDM    9. Hypothyroidism            Synthroid 114mcg   Maternal Medical History:   Past Medical History:  Diagnosis Date  . Asthma    allergy induced  . Hypothyroidism     History reviewed. No pertinent surgical history.  No Known Allergies  Prior to Admission medications   Medication Sig Start Date End Date Taking? Authorizing Provider  ampicillin (PRINCIPEN) 500 MG capsule Take 1 capsule (500 mg total) by mouth 3 (three) times daily for 5 days. 05/25/19 05/30/19 Yes  Linda Hedges, CNM  levothyroxine (SYNTHROID) 100 MCG tablet Take 100 mcg by mouth daily before breakfast.   Yes [provider]  Prenatal Vit-Fe Fumarate-FA (MULTIVITAMIN-PRENATAL) 27-0.8 MG TABS tablet Take 1 tablet by mouth daily at 12 noon.   Yes [provider]  aspirin 81 MG chewable tablet Chew by mouth daily.    [provider]     Prenatal care site: [place]  Social History: She  reports that she has never smoked. She has never used smokeless tobacco. She reports that she does not drink alcohol or use drugs.  Family History: family history is not on file.   Review of Systems: A full review of systems was performed and negative except as noted in the HPI.    Physical Exam:  Vital Signs: BP 128/61 (BP Location: Left Arm)   Pulse 92   Temp 98 F (36.7 C) (Oral)   Resp 18   Ht 6' (1.829 m)   Wt 107.5 kg   LMP 01/18/2019   BMI 32.14 kg/m   General:   alert, cooperative and moderate distress  Skin:  normal  Neurologic:    Alert & oriented x 3  Lungs:   nl effort  Heart:   regular rate and rhythm  Abdomen:  soft, non-tender  Pelvis:  Visual inspection showed umbilical cord loop hanging out of  the vagina.  SVE: umbilical cord only felt in the vagina.  Cervix:    Dilation: 3cm   Effacement: 50%   Station:  Floating   Consistency: soft   Position: n/a  Extremities: : non-tender, symmetric      No results found for this or any previous visit (from the past 24 hour(s)).  Pertinent Results:  Prenatal Labs: Blood type/Rh A pos  Antibody screen neg  Rubella Equivical  Varicella Immune  RPR NR  HBsAg Neg  HIV NR  GC neg  Chlamydia neg  Genetic screening Declined  1 hour GTT   3 hour GTT   GBS    Ultrasounds:   05/29/19 Bedside U/s by Dr. Leafy Ro confirmed absence of FHTs  US OB Limited  Date: 05/27/2019 Presentation: breech, EFW: CRL 11.83 cm c/w [redacted]w[redacted]d, FHR: 168, Placenta: Ant fundal, AFI: MVP 1.67 cm, Lt lower  fibroid: 80.0 mm  US OB Limited  Result Date: 05/25/2019 CLINICAL DATA:  Inability to hear fetal heart tones. Premature rupture of membranes. EXAM: LIMITED OBSTETRIC ULTRASOUND Comparison: May 24, 2019. FINDINGS: Number of Fetuses: 1 Heart Rate:  165 bpm Movement: Yes Presentation: Transverse Placental Location: Fundal and anterior. Previa: No Amniotic Fluid (Subjective):  Decreased. AFI: 0.3 cm BPD: 3.8 cm 17 w  5 d MATERNAL FINDINGS: Cervix:  Appears closed. Uterus/Adnexae: Large uterine fibroid is noted. IMPRESSION: Single live intrauterine gestation of 17 weeks 5 days with estimated heart rate of 165 beats per minute. However, oligohydramnios is again noted consistent with history of premature rupture of membranes. This exam is performed on an emergent basis and does not comprehensively evaluate fetal size, dating, or anatomy; follow-up complete OB US should be considered if further fetal assessment is warranted. Electronically Signed   By: Marijo Conception M.D.   On: 05/25/2019 10:57   US OB Limited  Result Date: 05/24/2019 CLINICAL DATA:  Unable to obtain fetal heart rate. Premature rupture of membranes. EXAM: LIMITED OBSTETRIC ULTRASOUND FINDINGS: Number of Fetuses: 1 Heart Rate:  178 bpm Movement: None Presentation: Transverse.  Head to the left side of mother. Placental Location: Anterior Previa: None Amniotic Fluid (Subjective):  Within normal limits. AFI: 1.3 cm BPD: 3.6 cm 17 w  1 d MATERNAL FINDINGS: Cervix:  Appears closed. Uterus/Adnexae: Prominent uterine fibroid is again noted in lower uterine segment measuring 8.4 x 8.3 x 8.1 cm. IMPRESSION: 1. Single viable intrauterine pregnancy with estimated fetal heart rate of 178 beats per minute. Estimated 2. Estimated gestational age is 39 weeks and 2 days. 3. Oligohydramnios, consistent with premature rupture of membranes. Cervix does appear closed. This exam is performed on an emergent basis and does not comprehensively evaluate fetal size,  dating, or anatomy; follow-up complete OB US should be considered if further fetal assessment is warranted. Electronically Signed   By: San Morelle M.D.   On: 05/24/2019 11:53     Assessment:  Jeanette Reyes is a 39 y.o. G2P1001 female at [redacted]w[redacted]d with suspecting the umbilical cord is in the vagina after a diagnosis of PV PPROM on 05/23/19.   Plan:  1. Admit to Labor & Delivery; consents reviewed and obtained, care co-managed with Dr. Leafy Ro  2. Fetal Viability and delivery preparation - FHT absent - INR lab added to orders   3. Admit care orders: - Prenatal labs reviewed, as above - Rh pos - CBC & T&S on admit - Clear fluids, IVF  4. Induction of Labor - No contractions or cramping on arrival  - Plan  for induction with Misoprostol 469mcg q4hrs until delivery -  Maternal pain control as desired - Anticipate vaginal delivery  5. Post Delivery Planning: - Offered Chaplain Services    [place], [place] 05/29/2019 10:05 AM

## 2019-05-29 NOTE — Progress Notes (Signed)
Labor Progress Note  Jeanette Reyes is a 39 y.o. G2P1001 at [redacted]w[redacted]d by LMP of 01/18/2019.  She presents to L&D with cord prolapse. PV PPROM since 05/23/19.  Subjective: Pt reports uterine pressure.  Denies bleeding, or cramping.    Objective: BP 112/64   Pulse 97   Temp 98.3 F (36.8 C) (Oral)   Resp 18   Ht 6' (1.829 m)   Wt 107.5 kg   LMP 01/18/2019   SpO2 97%   BMI 32.14 kg/m    SVE:    Dilation: 7 cm  Effacement: 90%  Station:  -1  Consistency: soft  Position: n/a   Labs: Lab Results  Component Value Date   WBC 12.8 (H) 05/29/2019   HGB 11.6 (L) 05/29/2019   HCT 35.0 (L) 05/29/2019   MCV 80.8 05/29/2019   PLT 183 05/29/2019    Assessment / Plan: G2P1 at [redacted]w[redacted]d induction for fetal demise after PPROM at 79 weeks.   Placed Misoprotol 400 mcg PV  Pain Control:  Reviewed the availibity of epidural if she would like I/D:  Last temp 98.3 Anticipated MOD:  Vaginal delivery  Clydene Laming, CNM 05/29/2019, 2:44 PM

## 2019-05-29 NOTE — Discharge Instructions (Signed)
Please call with fevers or body aches, or with increasing pain or bleeding. I'd like to see you in 1 week if you otherwise feel well.   Managing Pregnancy Loss Pregnancy loss can happen any time during a pregnancy. Often the cause is not known. It is rarely because of anything you did. Pregnancy loss in early pregnancy (during the first trimester) is called a miscarriage. This type of pregnancy loss is the most common. Pregnancy loss that happens after 20 weeks of pregnancy is called fetal demise if the baby's heart stops beating before birth. Fetal demise is much less common. Some women experience spontaneous labor shortly after fetal demise resulting in a stillborn birth (stillbirth). Any pregnancy loss can be devastating. You will need to recover both physically and emotionally. Most women are able to get pregnant again after a pregnancy loss and deliver a healthy baby.  Two resources are the organization "Now I lay me down to sleep" and the [employer] group at [facility].   How to manage emotional recovery  Pregnancy loss is very hard emotionally. You may feel many different emotions while you grieve. You may feel sad and angry. You may also feel guilty. It is normal to have periods of crying. Emotional recovery can take longer than physical recovery. It is different for everyone. Taking these steps can help you in managing this loss:  Remember that it is unlikely you did anything to cause the pregnancy loss.  Share your thoughts and feelings with friends, family, and your partner. Remember that your partner is also recovering emotionally.  Make sure you have a good support system. Do not spend too much time alone.  Meet with a pregnancy loss counselor or join a pregnancy loss support group.  Get enough sleep and eat a healthy diet. Return to regular exercise when you have recovered physically.  Do not use drugs or alcohol to manage your emotions.  Consider seeing a mental health  professional to help you recover emotionally.  Ask a friend or loved one to help you decide what to do with any clothing and nursery items you received for your baby. In the case of a stillbirth, many women benefit from taking additional steps in the grieving process. You may want to:  Hold your baby after the birth.  Name your baby.  Request a birth certificate.  Create a keepsake such as handprints or footprints.  Dress your baby and have a picture taken.  Make funeral arrangements.  Ask for a baptism or blessing. Hospitals have staff members who can help you with all these arrangements. How to recognize emotional stress It is normal to have emotional stress after a pregnancy loss. But emotional stress that lasts a long time or becomes severe requires treatment. Watch out for these signs of severe emotional stress:  Sadness, anger, or guilt that is not going away and is interfering with your normal activities.  Relationship problems that have occurred or gotten worse since the pregnancy loss.  Signs of depression that last longer than 2 weeks. These may include: ? Sadness. ? Anxiety. ? Hopelessness. ? Loss of interest in activities you enjoy. ? Inability to concentrate. ? Trouble sleeping or sleeping too much. ? Loss of appetite or overeating. ? Thoughts of death or of hurting yourself. Follow these instructions at home:  Take over-the-counter and prescription medicines only as told by your health care provider.  Rest at home until your energy level returns. Return to your normal activities as told by  your health care provider. Ask your health care provider what activities are safe for you.  When you are ready, meet with your health care provider to discuss steps to take for a future pregnancy.  Keep all follow-up visits as told by your health care provider. This is important. Where to find support  To help you and your partner with the process of grieving, talk with  your health care provider or seek counseling.  Consider meeting with others who have experienced pregnancy loss. Ask your health care provider about support groups and resources. Where to find more information  [place] Department of Health and Programmer, systems on Women's Health: [place]  American Pregnancy Association: www.americanpregnancy.org Contact a health care provider if:  You continue to experience grief, sadness, or lack of motivation for everyday activities, and those feelings do not improve over time.  You are struggling to recover emotionally, especially if you are using alcohol or substances to help. Get help right away if:  You have thoughts of hurting yourself or others. If you ever feel like you may hurt yourself or others, or have thoughts about taking your own life, get help right away. You can go to your nearest emergency department or call:  Your local emergency services (911 in the [place]  A suicide crisis helpline, such as the [place] at (510) 316-0837. This is open 24 hours a day. Summary  Any pregnancy loss can be difficult physically and emotionally.  You may experience many different emotions while you grieve. Emotional recovery can last longer than physical recovery.  It is normal to have emotional stress after a pregnancy loss. But emotional stress that lasts a long time or becomes severe requires treatment.  See your health care provider if you are struggling emotionally after a pregnancy loss. This information is not intended to replace advice given to you by your health care provider. Make sure you discuss any questions you have with your health care provider. Document Revised: 08/14/2018 Document Reviewed: 07/05/2017 Elsevier Patient Education  2020 [employer].

## 2019-05-29 NOTE — Progress Notes (Signed)
Labor Progress Note  Brendalee Binion is a 39 y.o. G2P1001 at [redacted]w[redacted]d by LMP of 01/18/2019.  She presents to L&D with cord prolapse. PV PPROM since 05/23/19.  Subjective: Called to bedside for delivery.  At arrival baby born and in mother's arms.  Placenta not delivered.  Objective: BP 112/64   Pulse 97   Temp 98.1 F (36.7 C) (Oral)   Resp 18   Ht 6' (1.829 m)   Wt 107.5 kg   LMP 01/18/2019   SpO2 97%   BMI 32.14 kg/m   Labs: Lab Results  Component Value Date   WBC 12.8 (H) 05/29/2019   HGB 11.6 (L) 05/29/2019   HCT 35.0 (L) 05/29/2019   MCV 80.8 05/29/2019   PLT 183 05/29/2019    Assessment / Plan: G2P1 at [redacted]w[redacted]d induction for fetal demise after PPROM at 53 weeks.   Baby born at 54, female C/w Dr. Leafy Ro  - Hemabate 224mcg IM, Lomotil 2 tabs PO and Tylenol 1g PO    - May need to give another dose of Cytotec after 1839 if placenta is not delivered  Pain Control:  Pt not requesting medication at this time I/D:  Last temp 98.1   Clydene Laming, CNM 05/29/2019, 4:21 PM

## 2019-05-29 NOTE — Progress Notes (Signed)
Admitting  for PV PPROM in possible labor

## 2019-05-29 NOTE — Discharge Summary (Signed)
Obstetrical Discharge Summary  Patient Name: Jeanette Reyes DOB: [birth year] MRN: [id number]  Date of Admission: 05/29/2019 Date of Discharge: 05/30/2019  Primary OB: [facility]  Gestational Age at Delivery: [redacted]w[redacted]d  Antepartum complications:   1. Advanced maternal age  B36ASA, 2. Prepregnancy BMI 32 ? A1c: 5.7, early 1h GTT 140  3h GTT [100,140,135,122] 3. Fibroid  ? 6x6x6cm on initial [facility]4. First trimester bleeding ? Spotting has been an issue throughout first trimester 5. Hx infertility for yrs ? spontaneous conception. 6. Rubella equivocal ? offer PP Vaccination 7. COVID illness during pregnancy ? Pos 11/26 ? Family tested positive recently 864 Hx macrosomic infant ? 9#7 at 41.3wks  SVD, no shoulder dystocia, no GDM 9. Hypothyroidism Synthroid 1026m  Admitting Diagnosis: PPROM, cord prolapse Secondary Diagnosis: Patient Active Problem List   Diagnosis Date Noted  . Amniotic fluid leaking 05/23/2019  . Preterm premature rupture of membranes (PPROM) with unknown onset of labor 05/23/2019   Augmentation: Cytotec Complications: Stillborn Intrapartum complications/course:  PPROM at 17+1wks, cord prolapse with fetal demise at home at 18wks  Date of Delivery: 05/29/2019 Delivered By: BeBenjaman Kindlerelivery Type: spontaneous vaginal delivery Anesthesia: none Placenta: extracted Laceration: none Episiotomy: none Newborn Data: Live born female "Micah"  Birth Weight:  pending APGAR: 0  Newborn Delivery   Birth date/time: 05/29/2019 16:02:00 Delivery type: Vaginal, Spontaneous       Brief Hospital Course  Jeanette Reyes a G2P1001 who had a SVD on 05/29/2019;  for further details of this delivery, please refer to the delivery note.  Patient had an uncomplicated postpartum course.  By time of discharge on PPD#1, her pain was controlled on oral pain medications; she had appropriate lochia and was ambulating, voiding without  difficulty and tolerating regular diet.  She was deemed stable for discharge to home.    Her repeat WBC after delivery was 17. She received a single dose of 2g Ancef after delivery of the placenta and was afebrile with vital signs stable. Strict infection precautions given.  Discharge Physical Exam:  BP (!) 114/58   Pulse 83   Temp 98.6 F (37 C) (Oral)   Resp 18   Ht 6' (1.829 m)   Wt 107.5 kg   LMP 01/18/2019   SpO2 97%   BMI 32.14 kg/m   General: NAD CV: RRR Pulm: CTABL, nl effort ABD: s/nd/nt, fundus firm and below the umbilicus Lochia: moderate DVT Evaluation: LE non-ttp, no evidence of DVT on exam.  Hemoglobin  Date Value Ref Range Status  05/29/2019 10.8 (L) 12.0 - 15.0 g/dL Final   HCT  Date Value Ref Range Status  05/29/2019 32.4 (L) 36.0 - 46.0 % Final    Post partum course: uncomplicated Postpartum Procedures: MMR due Disposition: stable, discharge to home. Baby Disposition: morgue  Rh Immune globulin given: Rubella vaccine given:  Tdap vaccine given in AP or PP setting:  Flu vaccine given in AP or PP setting:   Contraception: none  Prenatal Labs: Blood type/Rh --/--/A POS (01/21 094045 Antibody screen neg  Rubella Equivocal  Varicella Immune  RPR NR  HBsAg Neg  HIV NR  GC neg  Chlamydia neg  Genetic screening declined  1 hour GTT   3 hour GTT   GBS      Plan:  Jeanette Reyes to home in good condition. [facility]   Discharge Medications: Allergies as of 05/30/2019  No Known Allergies     Medication List    STOP taking these medications   ampicillin 500 MG capsule Commonly known as: PRINCIPEN   aspirin 81 MG chewable tablet     TAKE these medications   docusate sodium 100 MG capsule Commonly known as: Colace Take 1 capsule (100 mg total) by mouth 2 (two) times daily for 14 days. To keep stools soft, as needed   ferrous sulfate 325 (65  FE) MG tablet Take 1 tablet (325 mg total) by mouth daily with breakfast. Take with Vitamin C   ibuprofen 800 MG tablet Commonly known as: ADVIL Take 1 tablet (800 mg total) by mouth every 8 (eight) hours as needed for moderate pain or cramping.   levothyroxine 100 MCG tablet Commonly known as: SYNTHROID Take 100 mcg by mouth daily before breakfast.   multivitamin-prenatal 27-0.8 MG Tabs tablet Take 1 tablet by mouth daily at 12 noon.       Follow-up Information    Benjaman Kindler, MD. Schedule an appointment as soon as possible for a visit in 1 day(s).   Specialty: Obstetrics and Gynecology Contact information: [address] [place] [address] [phone]           Signed: Benjaman Kindler 06/25/19

## 2019-05-29 NOTE — Progress Notes (Signed)
39yo G2P1001 at 18+0wks who is on day 6 of Previable PPROM, presented today with cord prolapse from the vagina. No fetal heartbeat on evaluation. No contractions.  She received iv abx for PPROM over the weekend and is currently on oral abx.  Vitals:   05/29/19 1015 05/29/19 1020  BP:    Pulse:    Resp:    Temp:    SpO2: 98% 97%    WBC 12, up from 10 2 days ago Blood type A  Pos  Cervix: 3cm/thick/high, with multiple loops of cord present in the vagina Fundus: No fundal tenderness No odor or evidence on exam of IUI  A: Previable PPROM with cord prolapse, intrauterine fetal demise  P:  479mcg cytotec per vagina q4 hrs. Likelihood of retained placenta at 2 hrs 15% Serial temps: No abx at this time. Abx if IUI suspected Pastoral care consult if needed [place] if desired  Pt and husband at bedside; all questions answered

## 2019-06-02 ENCOUNTER — Encounter: Payer: Self-pay | Admitting: Obstetrics and Gynecology

## 2019-06-02 LAB — SURGICAL PATHOLOGY

## 2019-06-03 ENCOUNTER — Encounter: Payer: Self-pay | Admitting: Obstetrics and Gynecology

## 2019-12-07 IMAGING — US US OB < 14 WEEKS - US OB TV
1 series · 1 of 1 positions shown · non-contrast
Comparison: None.

CLINICAL DATA: Multi gravid first trimester pregnancy

EXAM:
OBSTETRIC <14 WK US AND TRANSVAGINAL OB US
TECHNIQUE: Transvaginal ultrasound was performed for complete evaluation of the
gestation as well as the maternal uterus, adnexal regions, and
pelvic cul-de-sac.

[Series 1: us ob < 14 weeks - us ob tv · 0.09mm/px · 1 of 1 slices shown]
[im 1/1]
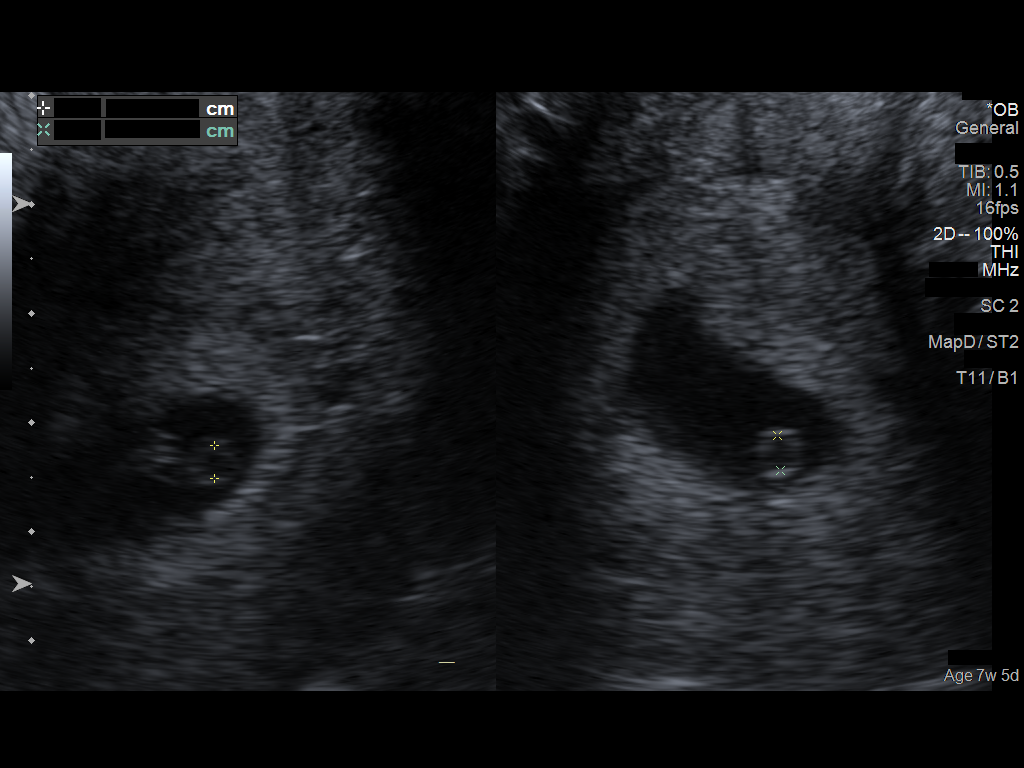

[1 of 1 positions shown; findings below may reference images not displayed]

FINDINGS: Intrauterine gestational sac: Single

Yolk sac:  Visualized

Embryo:  Visualized

Cardiac Activity: Visualized

Heart Rate: One hundred forty bpm

CRL: 9.9  mm   7 w 0 d                  US EDC: 10/30/2019

Subchorionic hemorrhage:  None visualized.

Maternal uterus/adnexae: 2.4 cm anechoic right ovarian mass likely
reflecting a cyst. 6.6 x 6.6 x 6.3 cm left lower uterine hypoechoic
mass most consistent with a fibroid. No pelvic free fluid.
IMPRESSION: 1. Single live intrauterine pregnancy as detailed above.
2. 6.6 x 6.6 x 6.3 cm left lower uterine fibroid.

## 2020-02-17 IMAGING — US US OB LIMITED
1 series · 14 of 25 positions shown · non-contrast
Comparison: none

CLINICAL DATA: Unable to obtain fetal heart rate. Premature rupture
of membranes.

EXAM:
LIMITED OBSTETRIC ULTRASOUND

[Series 1: us ob limited · 25 acquisitions, 14 frames shown]
[im 1/25]
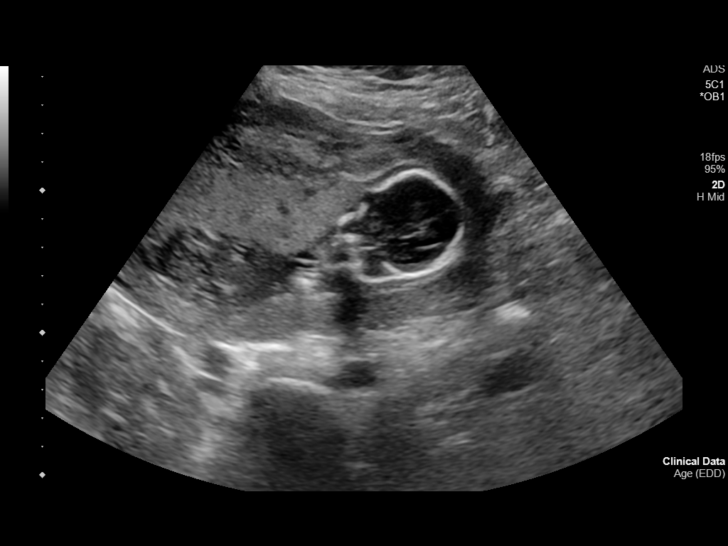
[im 3/25]
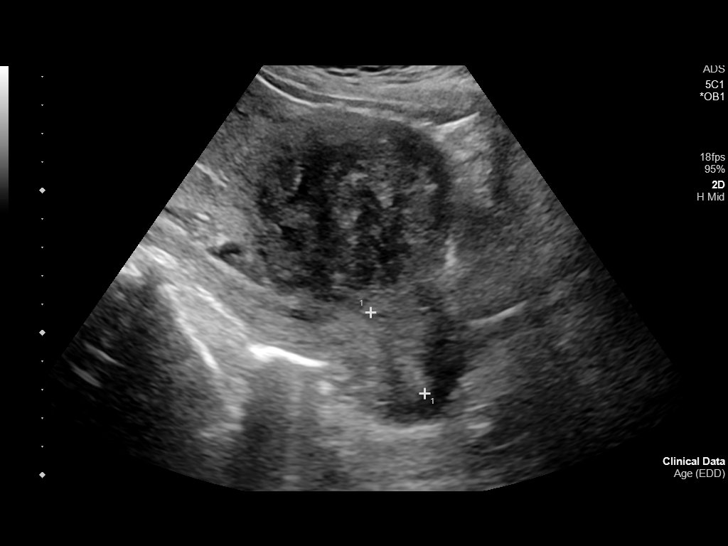
[im 5/25]
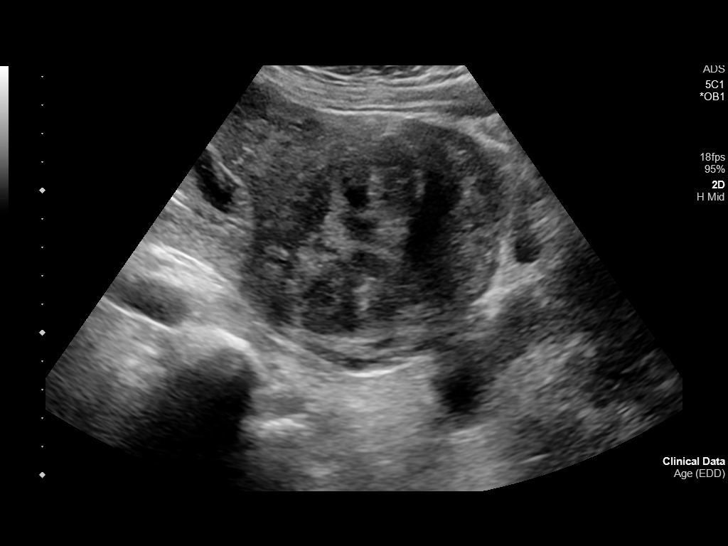
[im 7/25]
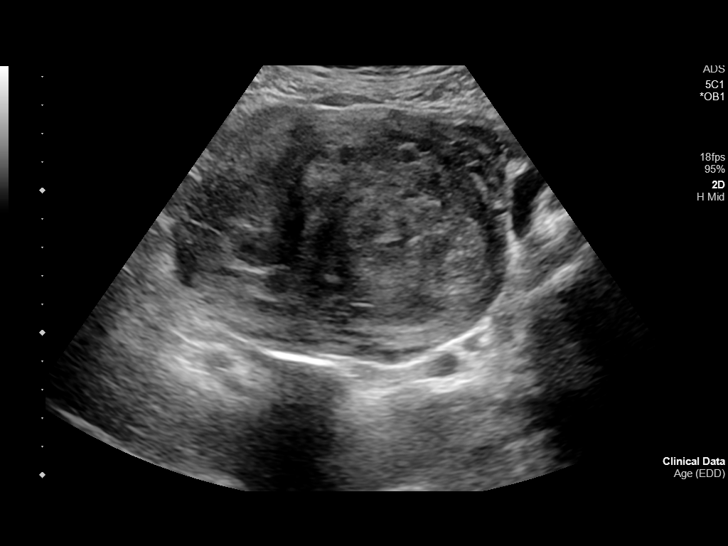
[im 9/25]
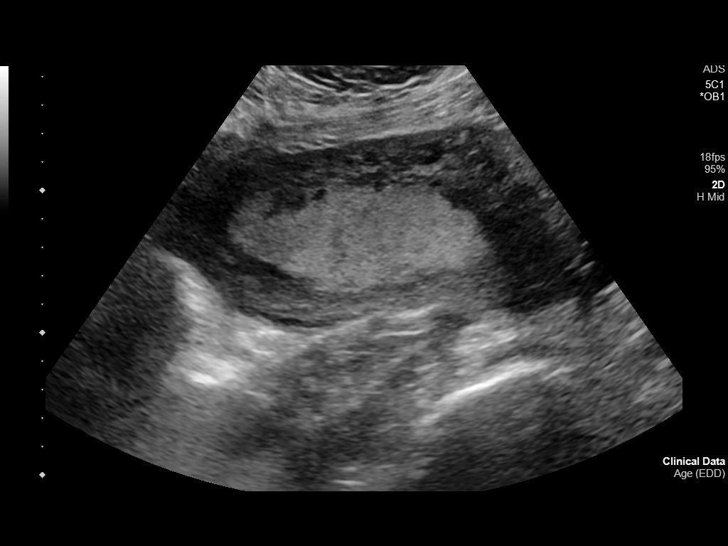
[im 10/25]
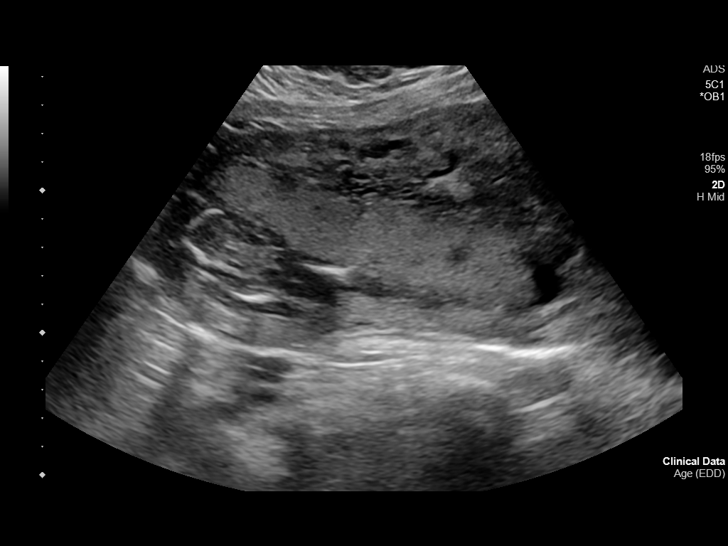
[im 12/25]
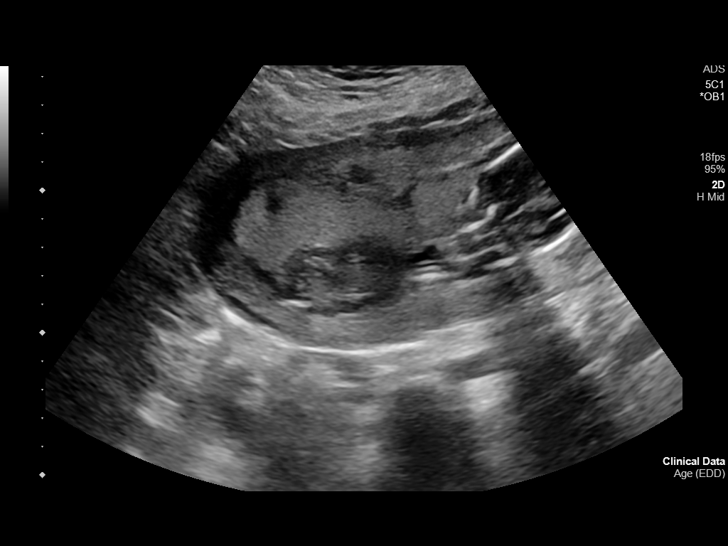
[im 14/25]
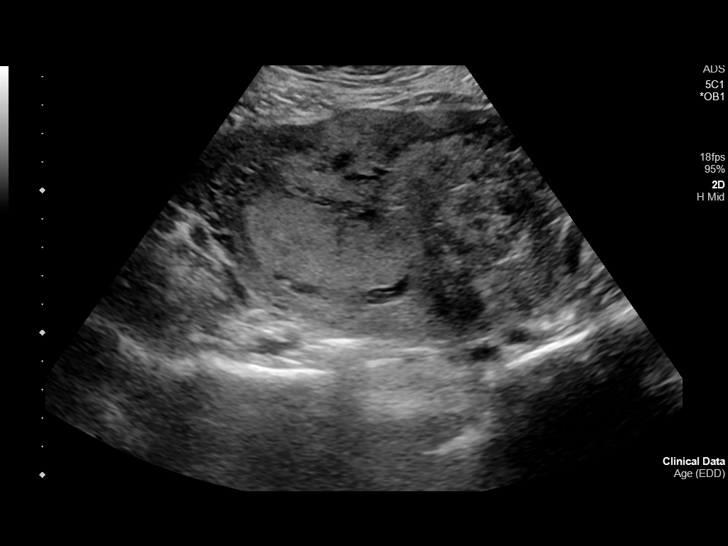
[im 16/25]
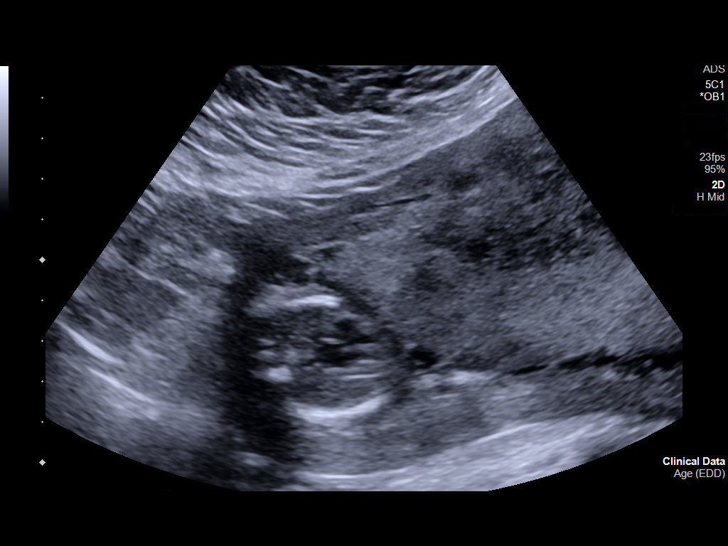
[im 17/25]
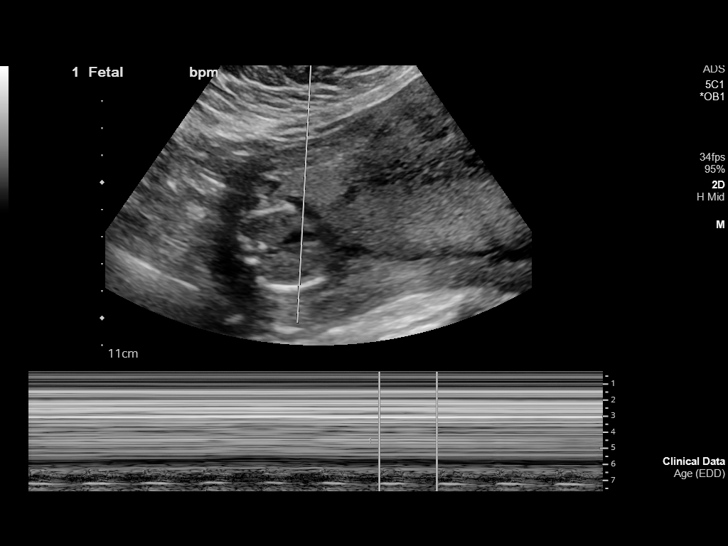
[im 19/25]
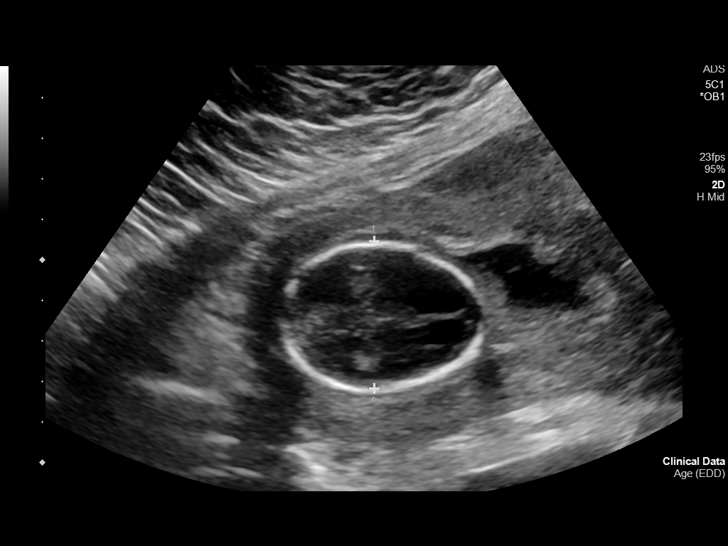
[im 21/25]
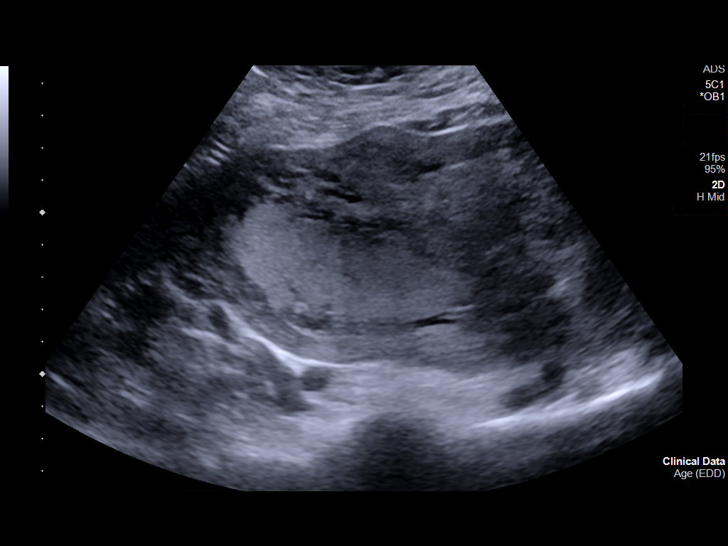
[im 23/25]
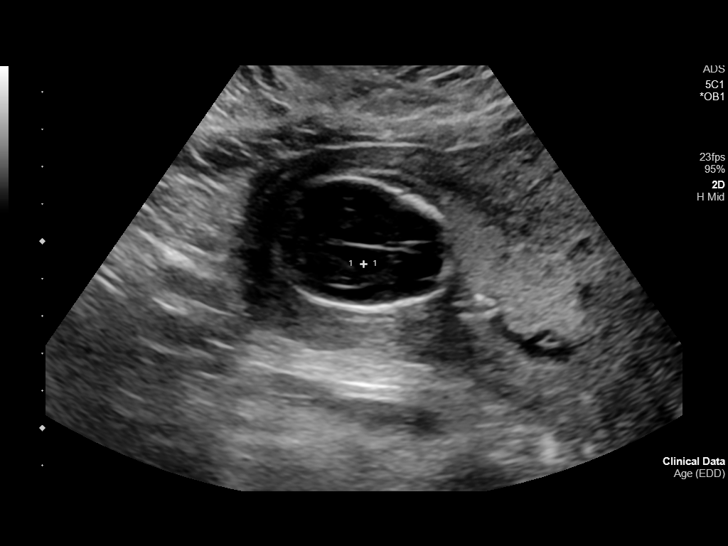
[im 25/25]
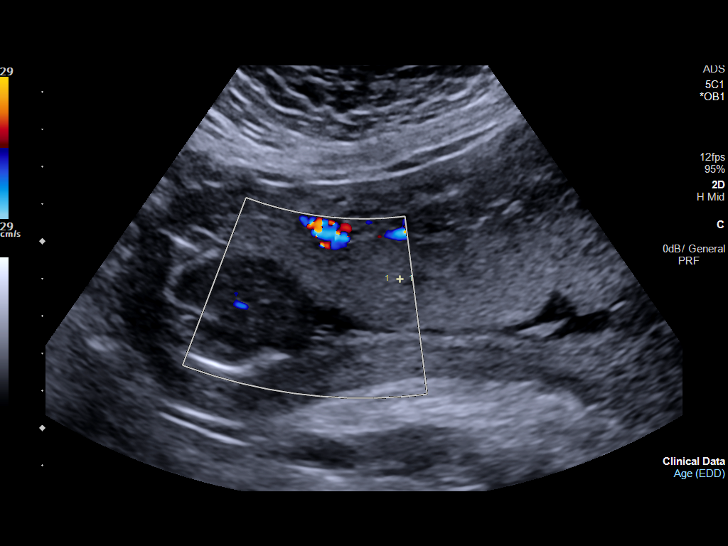

[14 of 25 positions shown; findings below may reference images not displayed]

FINDINGS: Number of Fetuses: 1

Heart Rate:  178 bpm

Movement: None

Presentation: Transverse.  Head to the left side of mother.

Placental Location: Anterior

Previa: None

Amniotic Fluid (Subjective):  Within normal limits.

AFI: 1.3 cm

BPD: 3.6 cm 17 w  1 d

MATERNAL FINDINGS:

Cervix:  Appears closed.

Uterus/Adnexae: Prominent uterine fibroid is again noted in lower
uterine segment measuring 8.4 x 8.3 x 8.1 cm.
IMPRESSION: 1. Single viable intrauterine pregnancy with estimated fetal heart
rate of 178 beats per minute. Estimated
2. Estimated gestational age is 17 weeks and 2 days.
3. Oligohydramnios, consistent with premature rupture of membranes.
Cervix does appear closed.

This exam is performed on an emergent basis and does not
comprehensively evaluate fetal size, dating, or anatomy; follow-up
complete OB US should be considered if further fetal assessment is
warranted.

## 2020-02-18 IMAGING — US US OB LIMITED
1 series · 14 of 28 positions shown · non-contrast
Comparison: May 24, 2019.

CLINICAL DATA: Inability to hear fetal heart tones. Premature
rupture of membranes.

EXAM:
LIMITED OBSTETRIC ULTRASOUND

[Series 1: us ob limited · 14 of 30 slices shown]
[im 2/30]
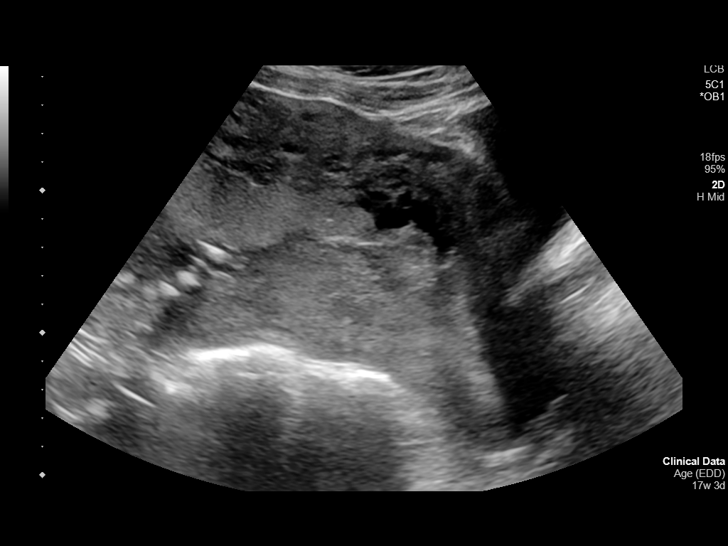
[im 4/30]
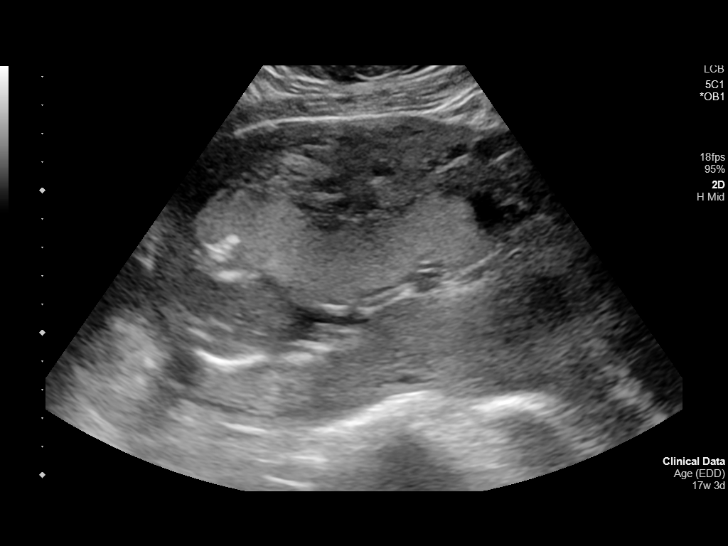
[im 6/30]
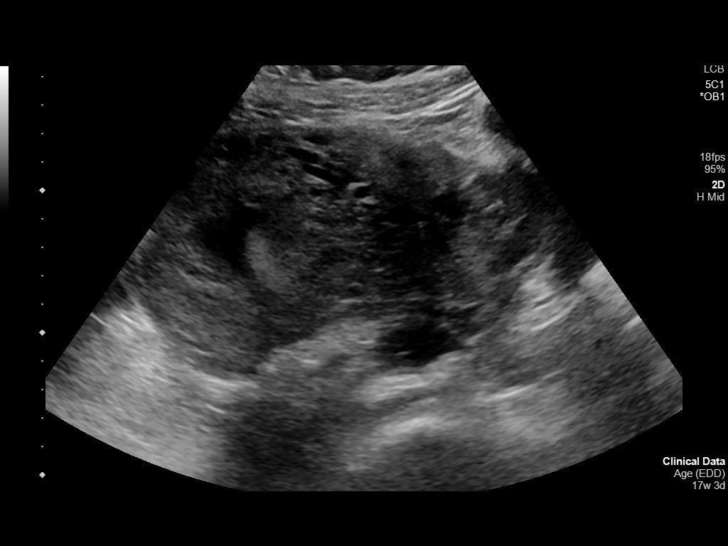
[im 8/30]
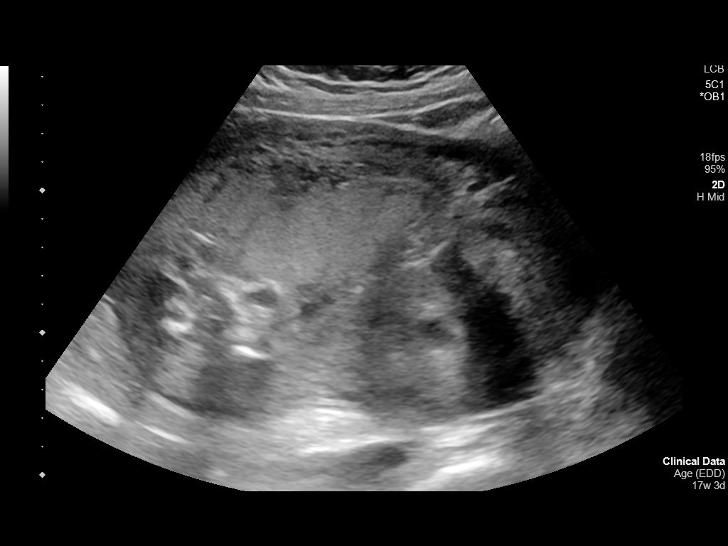
[im 10/30]
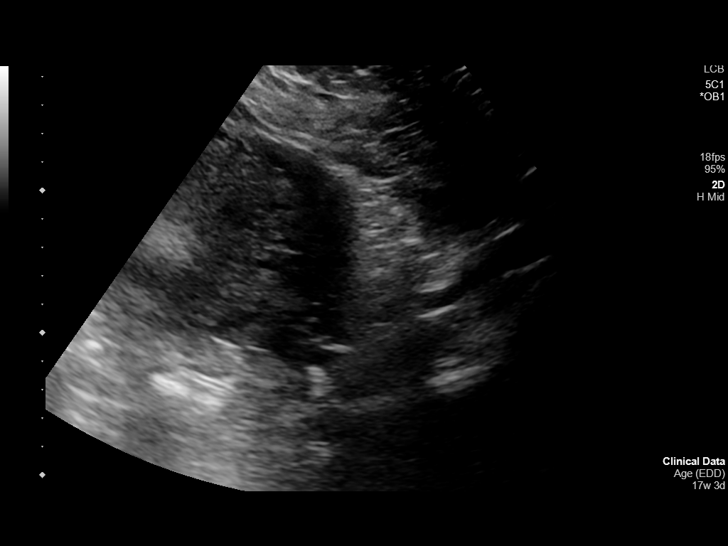
[im 12/30]
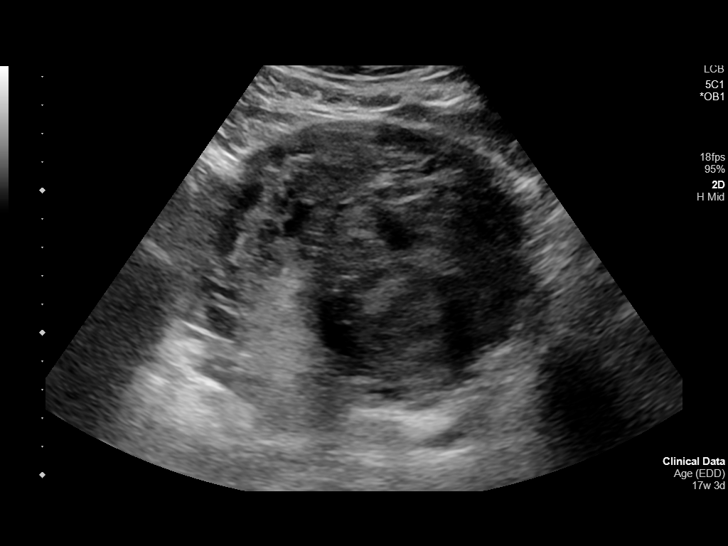
[im 14/30]
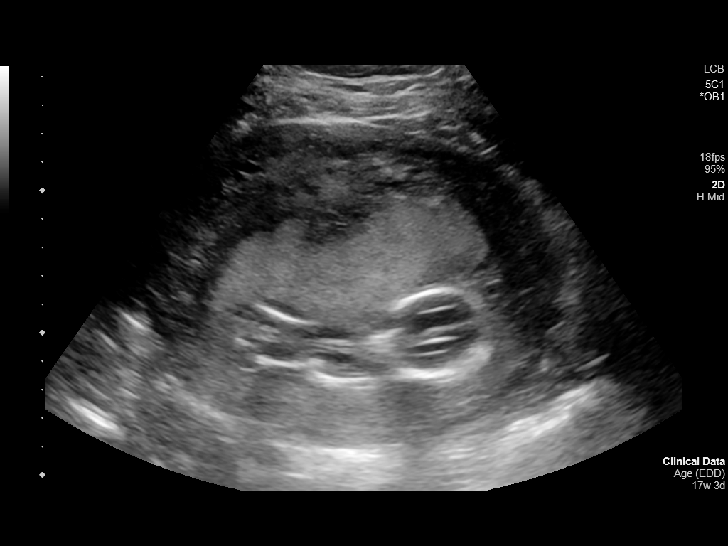
[im 17/30]
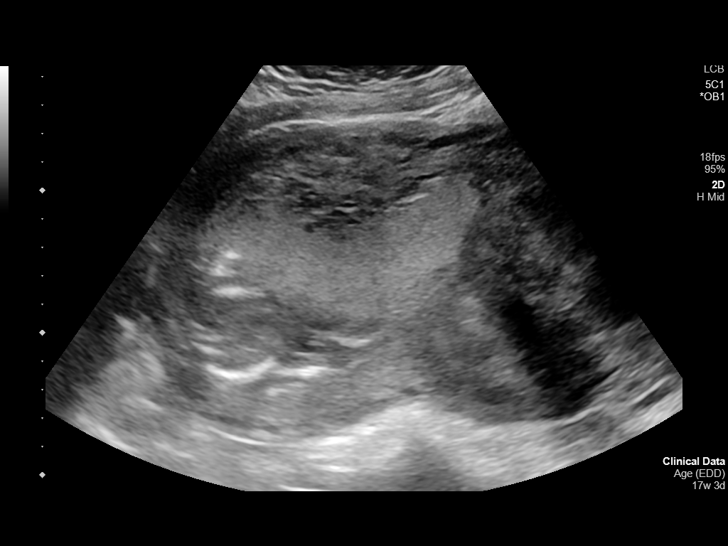
[im 19/30]
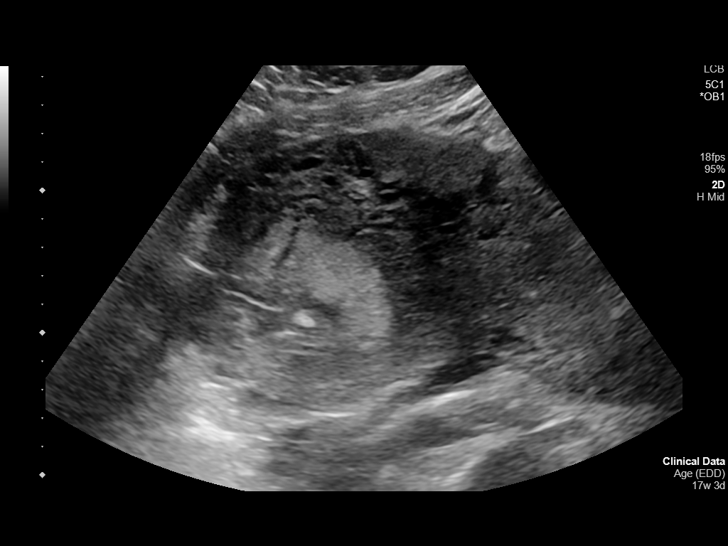
[im 21/30]
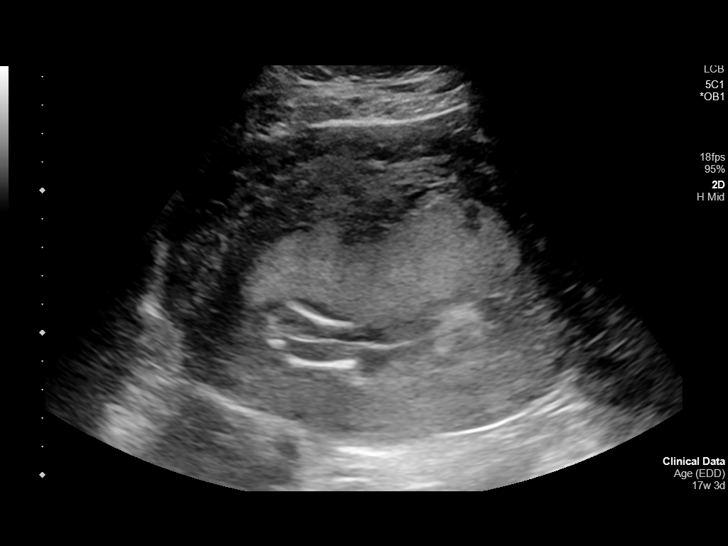
[im 23/30]
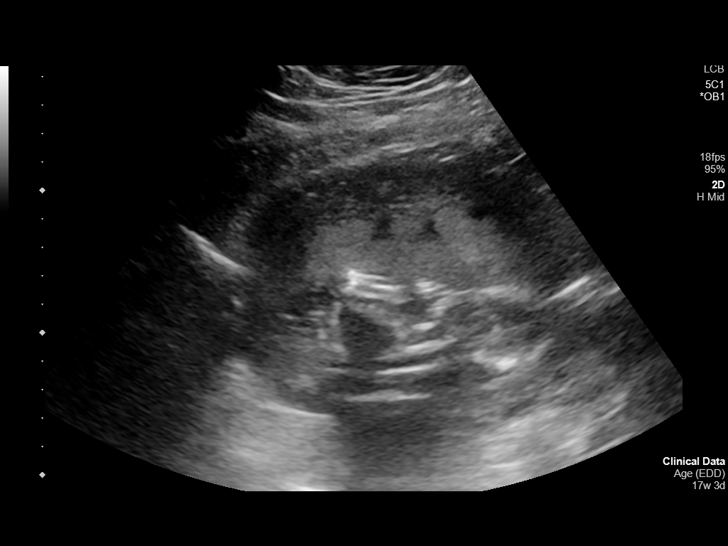
[im 25/30]
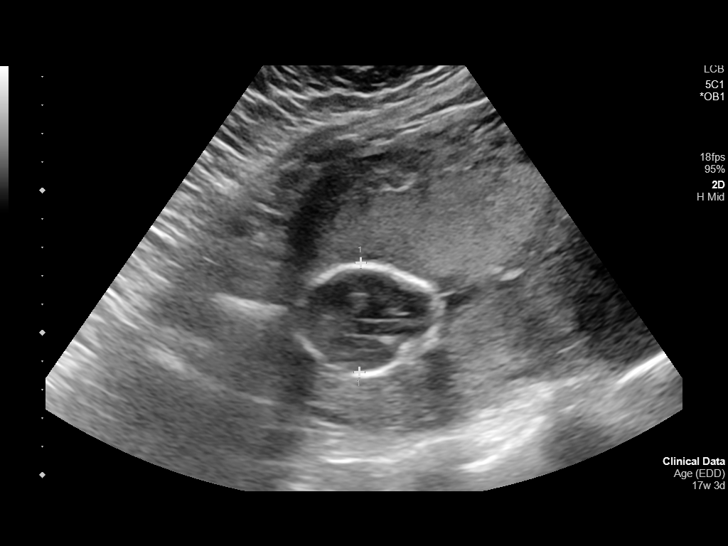
[im 27/30]
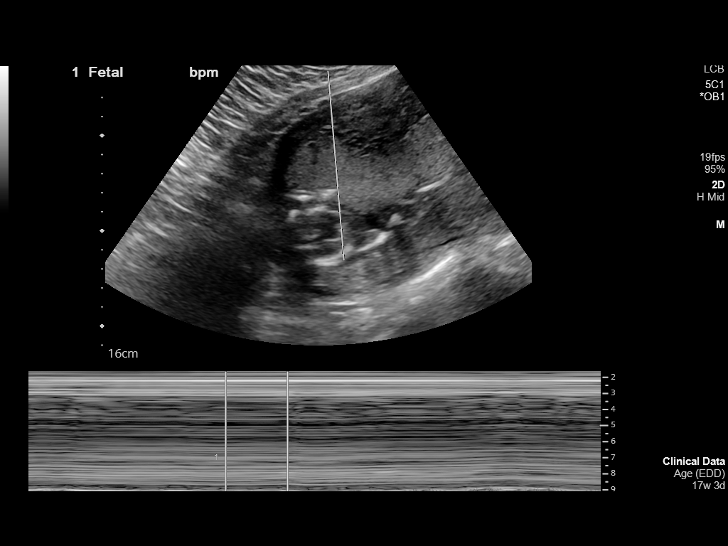
[im 30/30]
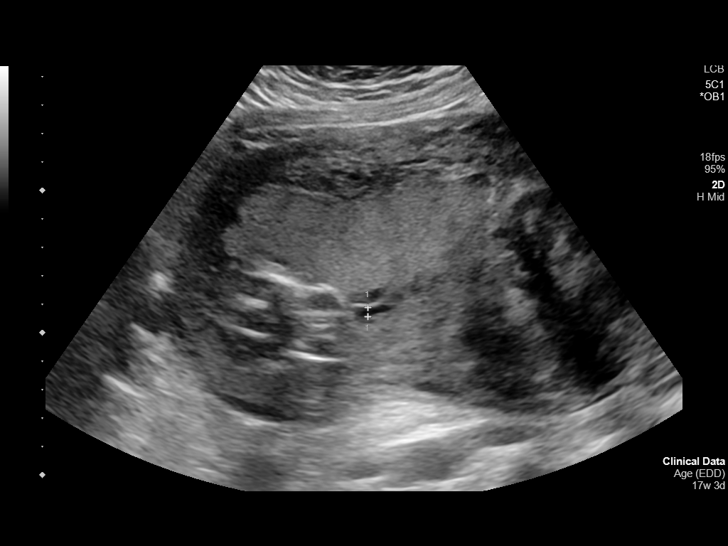

[14 of 28 positions shown; findings below may reference images not displayed]

FINDINGS: Number of Fetuses: 1

Heart Rate:  165 bpm

Movement: Yes

Presentation: Transverse

Placental Location: Fundal and anterior.

Previa: No

Amniotic Fluid (Subjective):  Decreased.

AFI: 0.3 cm

BPD: 3.8 cm 17 w  5 d

MATERNAL FINDINGS:

Cervix:  Appears closed.

Uterus/Adnexae: Large uterine fibroid is noted.
IMPRESSION: Single live intrauterine gestation of 17 weeks 5 days with estimated
heart rate of 165 beats per minute. However, oligohydramnios is
again noted consistent with history of premature rupture of
membranes.

This exam is performed on an emergent basis and does not
comprehensively evaluate fetal size, dating, or anatomy; follow-up
complete OB US should be considered if further fetal assessment is
warranted.

## 2024-02-14 ENCOUNTER — Other Ambulatory Visit: Payer: Self-pay | Admitting: Family Medicine

## 2024-02-14 DIAGNOSIS — Z1231 Encounter for screening mammogram for malignant neoplasm of breast: Secondary | ICD-10-CM
